# Patient Record
Sex: Female | Born: 1970 | Race: Black or African American | Hispanic: No | Marital: Single | Smoking: Current every day smoker
Health system: Southern US, Community
[De-identification: ages and names within clinical notes are randomized; demographics above are authoritative.]

## PROBLEM LIST (undated history)

## (undated) DIAGNOSIS — I10 Essential (primary) hypertension: Secondary | ICD-10-CM

## (undated) DIAGNOSIS — I469 Cardiac arrest, cause unspecified: Secondary | ICD-10-CM

## (undated) DIAGNOSIS — I517 Cardiomegaly: Secondary | ICD-10-CM

## (undated) HISTORY — DX: Cardiomegaly: I51.7

## (undated) HISTORY — DX: Cardiac arrest, cause unspecified: I46.9

---

## 1997-11-25 ENCOUNTER — Encounter: Admission: RE | Admit: 1997-11-25 | Discharge: 1997-11-25 | Payer: Self-pay | Admitting: Family Medicine

## 1997-12-09 ENCOUNTER — Encounter: Admission: RE | Admit: 1997-12-09 | Discharge: 1997-12-09 | Payer: Self-pay | Admitting: Sports Medicine

## 1998-07-25 ENCOUNTER — Encounter: Admission: RE | Admit: 1998-07-25 | Discharge: 1998-07-25 | Payer: Self-pay | Admitting: Family Medicine

## 1999-07-16 ENCOUNTER — Encounter: Admission: RE | Admit: 1999-07-16 | Discharge: 1999-07-16 | Payer: Self-pay | Admitting: Family Medicine

## 2000-10-14 ENCOUNTER — Inpatient Hospital Stay (HOSPITAL_COMMUNITY): Admission: RE | Admit: 2000-10-14 | Discharge: 2000-10-14 | Payer: Self-pay | Admitting: Obstetrics & Gynecology

## 2000-10-14 ENCOUNTER — Encounter: Admission: RE | Admit: 2000-10-14 | Discharge: 2000-10-14 | Payer: Self-pay | Admitting: Family Medicine

## 2000-10-14 ENCOUNTER — Encounter: Payer: Self-pay | Admitting: Obstetrics & Gynecology

## 2000-10-15 ENCOUNTER — Inpatient Hospital Stay (HOSPITAL_COMMUNITY): Admission: AD | Admit: 2000-10-15 | Discharge: 2000-10-15 | Payer: Self-pay | Admitting: *Deleted

## 2006-09-02 ENCOUNTER — Inpatient Hospital Stay (HOSPITAL_COMMUNITY): Admission: AD | Admit: 2006-09-02 | Discharge: 2006-09-05 | Payer: Self-pay | Admitting: Family Medicine

## 2006-09-02 ENCOUNTER — Ambulatory Visit: Payer: Self-pay | Admitting: Family Medicine

## 2006-09-02 ENCOUNTER — Encounter (INDEPENDENT_AMBULATORY_CARE_PROVIDER_SITE_OTHER): Payer: Self-pay | Admitting: Specialist

## 2006-09-05 ENCOUNTER — Telehealth: Payer: Self-pay | Admitting: *Deleted

## 2006-10-05 ENCOUNTER — Encounter (INDEPENDENT_AMBULATORY_CARE_PROVIDER_SITE_OTHER): Payer: Self-pay | Admitting: *Deleted

## 2007-01-09 ENCOUNTER — Ambulatory Visit: Payer: Self-pay | Admitting: Sports Medicine

## 2007-01-09 ENCOUNTER — Encounter (INDEPENDENT_AMBULATORY_CARE_PROVIDER_SITE_OTHER): Payer: Self-pay | Admitting: Family Medicine

## 2007-01-09 DIAGNOSIS — I10 Essential (primary) hypertension: Secondary | ICD-10-CM

## 2007-01-09 LAB — CONVERTED CEMR LAB
BUN: 15 mg/dL (ref 6–23)
Beta hcg, urine, semiquantitative: NEGATIVE
CO2: 23 meq/L (ref 19–32)
Calcium: 8.9 mg/dL (ref 8.4–10.5)
Chloride: 109 meq/L (ref 96–112)
Creatinine, Ser: 0.89 mg/dL (ref 0.40–1.20)
Glucose, Bld: 90 mg/dL (ref 70–99)
Potassium: 4.4 meq/L (ref 3.5–5.3)
Sodium: 142 meq/L (ref 135–145)

## 2007-01-10 ENCOUNTER — Encounter (INDEPENDENT_AMBULATORY_CARE_PROVIDER_SITE_OTHER): Payer: Self-pay | Admitting: Family Medicine

## 2007-03-15 ENCOUNTER — Telehealth (INDEPENDENT_AMBULATORY_CARE_PROVIDER_SITE_OTHER): Payer: Self-pay | Admitting: Family Medicine

## 2007-05-04 ENCOUNTER — Telehealth: Payer: Self-pay | Admitting: *Deleted

## 2007-09-21 ENCOUNTER — Emergency Department (HOSPITAL_COMMUNITY): Admission: EM | Admit: 2007-09-21 | Discharge: 2007-09-21 | Payer: Self-pay | Admitting: Family Medicine

## 2009-08-05 ENCOUNTER — Encounter: Payer: Self-pay | Admitting: *Deleted

## 2010-03-21 ENCOUNTER — Emergency Department (HOSPITAL_COMMUNITY): Admission: EM | Admit: 2010-03-21 | Discharge: 2010-03-21 | Payer: Self-pay | Admitting: Emergency Medicine

## 2010-05-28 NOTE — Miscellaneous (Signed)
Summary: HTN  Clinical Lists Changes 200/108. feels fine. no medical care in 3 years. no meds for bp in 3 years. has a HA & does not feel well. sent her to ED. told her we are not taking new pts yet but can apply & we will call when we are open to new pt again.told her she must go to ED NOW. she agreed.[place] RN  August 05, 2009 2:42 PM

## 2010-07-07 LAB — POCT I-STAT, CHEM 8
BUN: 13 mg/dL (ref 6–23)
Calcium, Ion: 1.19 mmol/L (ref 1.12–1.32)
Chloride: 105 mEq/L (ref 96–112)
Creatinine, Ser: 0.9 mg/dL (ref 0.4–1.2)
Glucose, Bld: 87 mg/dL (ref 70–99)
HCT: 41 % (ref 36.0–46.0)
Hemoglobin: 13.9 g/dL (ref 12.0–15.0)
Potassium: 3.8 mEq/L (ref 3.5–5.1)
Sodium: 138 mEq/L (ref 135–145)
TCO2: 26 mmol/L (ref 0–100)

## 2010-07-07 LAB — POCT URINALYSIS DIPSTICK
Bilirubin Urine: NEGATIVE
Glucose, UA: NEGATIVE mg/dL
Hgb urine dipstick: NEGATIVE
Ketones, ur: NEGATIVE mg/dL
Nitrite: NEGATIVE
Protein, ur: NEGATIVE mg/dL
Specific Gravity, Urine: 1.02 (ref 1.005–1.030)
Urobilinogen, UA: 0.2 mg/dL (ref 0.0–1.0)
pH: 6 (ref 5.0–8.0)

## 2010-09-08 NOTE — Discharge Summary (Signed)
Raven Martinez, Raven Martinez              ACCOUNT NO.:  [id number]   MEDICAL RECORD NO.:  [id number]          PATIENT TYPE:  INP   LOCATION:  9372                          FACILITY:  [facility]   PHYSICIAN:  Tanya S. [name], M.D.   DATE OF BIRTH:  1971/01/05   DATE OF ADMISSION:  09/02/2006  DATE OF DISCHARGE:  09/05/2006                               DISCHARGE SUMMARY   DISCHARGE DIAGNOSES:  1. Primary low transverse cesarean section for premature rupture of      membranes, breech presentation and hydramnios, variable      decelerations.  2. Chronic hypertension.  3. Drug abuse - marijuana.   DISCHARGE LABORATORIES:  Blood type O positive.  Urine drug screen - THC  positive, but otherwise negative.  This was repeated with the same  result on the day prior to discharge.  Hemoglobin 11.1 prior to cesarean  section and platelet count 167 prior to cesarean section.  HIV  nonreactive.  Creatinine 0.69.  AST 17, ALT 9, glucose 105, LDH 139,  uric acid 4.9.  Rubella immune, RPR nonreactive, hepatitis B surface  antigen negative, sickle screen negative, post cesarean section  hemoglobin 10.8 with platelet count 145.   HOSPITAL COURSE:  The patient is a 40 year old female who presented on  Sep 02, 2006 about 1-1/2 hours after her rupture of membranes.  She  stated that she was having contractions about every 5 minutes.  The  patient's history is complicated by her not receiving any prenatal care  during this pregnancy secondary to not having insurance per patient.  Blood pressure was noted to be markedly elevated in the emergency  department, up to 200s/120s.  She did not have any complaints of  headache, upper abdominal pain, lower extremity swelling, visual  changes, nor did she voice any other complaints.  She denied vaginal  bleeding.  She did have slight new vaginal discharge.  She stated to [place]  that she had been followed for hypertension previously, but has not seen  a physician in some years,  and is not taking any medications for this.  She does smoke cigarettes, and also smokes marijuana.   Other prenatal records were not available from previous pregnancies,  though the patient states that she has had 3 spontaneous vaginal  deliveries and one spontaneous abortion which was her G4 pregnancy.  As  such, she is G5, P3-0-1-3 prior to admission.  On ultrasound, the baby  was found to be approximately 1920 gm with a breech presentation.  There  was no previa noticed.  AFI was markedly decreased to absent.  The  cervix was open, and the fetus was measuring 32 and 6/7ths weeks.   The patient was taken to the operating room for a primary low transverse  cesarean section based on anhydramnios, breech presentation, suspected  premature rupture of membranes and possible preeclampsia, which we were  unsure about because the labs have not returned at that time.  Estimated  blood loss during the procedure was 600 mL, cord pH of 7.31.  A viable  female infant was delivered with Apgars of 8/9, weighing  1725 gm with a  length of 41.5 cm.  The NICU team was there at delivery, and the infant  went to the NICU after delivery.  Infant required blow-by O2 for 4  minutes.  The infant was then breathing spontaneously.  The placenta was  manually removed and had a 3-vessel cord.  The time of delivery of the  infant was 1734 on Sep 02, 2006, and time of delivery of the placenta was  1735 on Sep 02, 2006.   The patient went to the AICU following delivery.  She had been placed on  magnesium shortly after delivery and lisinopril for blood pressure  control with labetalol and hydralazine for systolic blood pressure  greater than 160 and diastolic blood pressure greater than 110.  Blood  pressures ranged from 127-167/77-100 on this regimen.  Leading up to  discharge, her blood pressures ranged from 140-180/87-123.  As such, her  blood pressure regimen was changed.  Her magnesium was stopped, and she  was  placed on hydrochlorothiazide/lisinopril 25/20, and metoprolol 25 mg  b.i.d. with close follow up at [facility] in 2  weeks for a blood pressure check and to change her medications as  appropriate.  She was bottle feeding.  Social work was consulted based  on the patient having marijuana in her urine.  She stated that she had  used marijuana about 1 month ago, but says that she is only an  occasional user.  Social work will continue to follow as an outpatient.  She denied CPS involved with any of her kids.  She was discharged with  ibuprofen and Percocet for pain control.  Depo-Provera was given prior  to discharge for contraception.  Staples were removed prior to  discharge.  An appointment was made at [facility]  with Dr. __________ for follow up of blood pressure management.  She was  discharged on a prenatal vitamin for 6 weeks and also Colace while she  is taking the Percocet.     ______________________________  Norton Blizzard, M.D.    ______________________________  [name]. [name], M.D.    SH/MEDQ  D:  09/05/2006  T:  09/05/2006  Job:  [id number]   cc:   Patricia Nettle(?) Jennefer Bravo(?), M.D.  [facility]

## 2010-09-08 NOTE — Op Note (Signed)
Raven Martinez              ACCOUNT NO.:  [id number]   MEDICAL RECORD NO.:  [id number]          PATIENT TYPE:  INP   LOCATION:  9198                          FACILITY:  [facility]   PHYSICIAN:  Tanya S. [name], M.D.   DATE OF BIRTH:  [birth year]   DATE OF PROCEDURE:  09/02/2006  DATE OF DISCHARGE:                               OPERATIVE REPORT   PREOPERATIVE DIAGNOSIS:  Intrauterine pregnancy at 33 weeks, possible  intrauterine growth restriction, breech presentation, anhydramnios,  hypertension, variable decelerations, no prenatal care.   POSTOPERATIVE DIAGNOSIS:  Intrauterine pregnancy at 33 weeks, possible  intrauterine growth restriction, breech presentation, anhydramnios,  hypertension, variable decelerations, no prenatal care.   PROCEDURE:  Primary low transverse cesarean section.   SURGEON:  [name]. [name], M.D.   ASSISTANT:  Paticia Stack, M.D.   ANESTHESIA:  Spinal/local.   SPECIMENS:  Placenta to pathology.   ESTIMATED BLOOD LOSS:  600 mL.   COMPLICATIONS:  None known immediately.   FINDINGS:  Viable female infant with Apgars 8 and 9.  Cord pH 7.31.  The  baby did go to the NICU, I do not have the weight at the time of  dictation.   REASON FOR PROCEDURE:  Briefly, the patient is a 40 year old gravida 5,  para 3-0-1-0, at 40 weeks by LMP, whose ultrasound today measured 37/6  with asymmetric looking growth, the head was measuring 33 to 34 weeks,  but the body was measuring more like 31, was found to be ruptured in the  unit, had severe hypertension with blood pressures in the 190/100 range.  Labs were all normal but had an anhydramnios by ultrasound and  repetitive variable decels into the 60s and a premature baby in a breech  presentation.  The decision was made to take her to the operating room  for delivery.   DESCRIPTION OF PROCEDURE:  The patient was taken to the OR where she was  placed in the dorsal supine position in Walden stirrups.  After spinal  analgesia was administered, she was prepped and draped in the usual  sterile fashion.  Once spinal analgesia was felt to be adequate, a low  Pfannenstiel incision was made with the knife and carried down to the  underlying fascia which was divided in the midline.  The rest of the  dissection went bluntly with the fascia being separated, the rectus  being separated, the peritoneal cavity being entered bluntly, bladder  blade was placed inside the peritoneum.  A low transverse incision was  made in the uterus and carried down to the underlying amniotic cavity.  The infant was found to be in a frank breech position, was delivered  easily, found to have a nuchal cord x1.  The infant was delivered, the  cord was clamped x2, and the infant was taking to the awaiting  pediatrics.  Cord pH and cord blood were obtained.  The placenta was  delivered from the uterus.  The uterus was cleaned with a dry lap pad.  The edges of the uterine incision was grasped with ring forceps.  The  uterine incision was  closed with 0 Vicryl suture in a locked running  fashion.  A second imbricating layer was used.  Two figure-of-eights  were used to assure hemostasis.  The uterine incision was felt to be  hemostatic.  Attention was turned to the fascia.  The fascia was closed  with a 0 Vicryl suture in a running fashion.  Any bleeding in the subcu  were cauterized with the electrocautery and the skin was closed using  clips.  30 mL of 0.25% Marcaine were injected into the incision.  All  instrument, needle, and lap counts were correct x 2.  The patient was  awakened and taken to the recovery room in stable condition.           ______________________________  [name] [name], M.D.     TSP/MEDQ  D:  09/02/2006  T:  09/02/2006  Job:  [id number]

## 2017-01-15 ENCOUNTER — Ambulatory Visit (INDEPENDENT_AMBULATORY_CARE_PROVIDER_SITE_OTHER): Payer: Self-pay

## 2017-01-15 ENCOUNTER — Ambulatory Visit (HOSPITAL_COMMUNITY)
Admission: EM | Admit: 2017-01-15 | Discharge: 2017-01-15 | Disposition: A | Discharge status: Home / Self Care | Payer: Self-pay | Attending: Family Medicine | Admitting: Family Medicine

## 2017-01-15 ENCOUNTER — Encounter (HOSPITAL_COMMUNITY): Payer: Self-pay | Admitting: Emergency Medicine

## 2017-01-15 DIAGNOSIS — M549 Dorsalgia, unspecified: Secondary | ICD-10-CM

## 2017-01-15 DIAGNOSIS — M546 Pain in thoracic spine: Secondary | ICD-10-CM

## 2017-01-15 DIAGNOSIS — M5489 Other dorsalgia: Secondary | ICD-10-CM

## 2017-01-15 DIAGNOSIS — I10 Essential (primary) hypertension: Secondary | ICD-10-CM

## 2017-01-15 MED ORDER — CYCLOBENZAPRINE HCL 10 MG PO TABS: 10.0000 mg | ORAL_TABLET | Freq: Three times a day (TID) | ORAL | 0 refills | Status: DC
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
## 2017-02-01 ENCOUNTER — Encounter (HOSPITAL_COMMUNITY): Payer: Self-pay | Admitting: Emergency Medicine

## 2017-02-01 ENCOUNTER — Inpatient Hospital Stay (HOSPITAL_COMMUNITY)
Admission: EM | Admit: 2017-02-01 | Discharge: 2017-02-10 | DRG: 224 | Disposition: A | Discharge status: Home / Self Care | Payer: Medicaid Other | Attending: Internal Medicine | Admitting: Internal Medicine

## 2017-02-01 ENCOUNTER — Emergency Department (HOSPITAL_COMMUNITY): Payer: Medicaid Other

## 2017-02-01 DIAGNOSIS — I421 Obstructive hypertrophic cardiomyopathy: Secondary | ICD-10-CM | POA: Diagnosis present

## 2017-02-01 DIAGNOSIS — R9431 Abnormal electrocardiogram [ECG] [EKG]: Secondary | ICD-10-CM | POA: Diagnosis present

## 2017-02-01 DIAGNOSIS — F17200 Nicotine dependence, unspecified, uncomplicated: Secondary | ICD-10-CM | POA: Diagnosis present

## 2017-02-01 DIAGNOSIS — D649 Anemia, unspecified: Secondary | ICD-10-CM | POA: Diagnosis present

## 2017-02-01 DIAGNOSIS — J690 Pneumonitis due to inhalation of food and vomit: Secondary | ICD-10-CM | POA: Diagnosis not present

## 2017-02-01 DIAGNOSIS — E876 Hypokalemia: Secondary | ICD-10-CM

## 2017-02-01 DIAGNOSIS — Z79899 Other long term (current) drug therapy: Secondary | ICD-10-CM

## 2017-02-01 DIAGNOSIS — Z452 Encounter for adjustment and management of vascular access device: Secondary | ICD-10-CM

## 2017-02-01 DIAGNOSIS — I462 Cardiac arrest due to underlying cardiac condition: Secondary | ICD-10-CM | POA: Diagnosis present

## 2017-02-01 DIAGNOSIS — J9601 Acute respiratory failure with hypoxia: Secondary | ICD-10-CM

## 2017-02-01 DIAGNOSIS — I469 Cardiac arrest, cause unspecified: Secondary | ICD-10-CM

## 2017-02-01 DIAGNOSIS — I471 Supraventricular tachycardia: Secondary | ICD-10-CM | POA: Diagnosis not present

## 2017-02-01 DIAGNOSIS — Z959 Presence of cardiac and vascular implant and graft, unspecified: Secondary | ICD-10-CM

## 2017-02-01 DIAGNOSIS — E875 Hyperkalemia: Secondary | ICD-10-CM | POA: Diagnosis not present

## 2017-02-01 DIAGNOSIS — R011 Cardiac murmur, unspecified: Secondary | ICD-10-CM | POA: Diagnosis present

## 2017-02-01 DIAGNOSIS — E269 Hyperaldosteronism, unspecified: Secondary | ICD-10-CM | POA: Diagnosis present

## 2017-02-01 DIAGNOSIS — J189 Pneumonia, unspecified organism: Secondary | ICD-10-CM

## 2017-02-01 DIAGNOSIS — J9690 Respiratory failure, unspecified, unspecified whether with hypoxia or hypercapnia: Secondary | ICD-10-CM

## 2017-02-01 DIAGNOSIS — I499 Cardiac arrhythmia, unspecified: Secondary | ICD-10-CM | POA: Diagnosis not present

## 2017-02-01 DIAGNOSIS — I509 Heart failure, unspecified: Secondary | ICD-10-CM

## 2017-02-01 DIAGNOSIS — I472 Ventricular tachycardia: Secondary | ICD-10-CM | POA: Diagnosis present

## 2017-02-01 DIAGNOSIS — N179 Acute kidney failure, unspecified: Secondary | ICD-10-CM | POA: Diagnosis present

## 2017-02-01 DIAGNOSIS — R739 Hyperglycemia, unspecified: Secondary | ICD-10-CM | POA: Diagnosis present

## 2017-02-01 DIAGNOSIS — I110 Hypertensive heart disease with heart failure: Secondary | ICD-10-CM | POA: Diagnosis present

## 2017-02-01 DIAGNOSIS — G931 Anoxic brain damage, not elsewhere classified: Secondary | ICD-10-CM

## 2017-02-01 DIAGNOSIS — E872 Acidosis: Secondary | ICD-10-CM | POA: Diagnosis present

## 2017-02-01 DIAGNOSIS — I4901 Ventricular fibrillation: Secondary | ICD-10-CM

## 2017-02-01 DIAGNOSIS — I5021 Acute systolic (congestive) heart failure: Secondary | ICD-10-CM | POA: Diagnosis present

## 2017-02-01 DIAGNOSIS — I5043 Acute on chronic combined systolic (congestive) and diastolic (congestive) heart failure: Secondary | ICD-10-CM | POA: Diagnosis present

## 2017-02-01 DIAGNOSIS — Z791 Long term (current) use of non-steroidal anti-inflammatories (NSAID): Secondary | ICD-10-CM

## 2017-02-01 DIAGNOSIS — Z978 Presence of other specified devices: Secondary | ICD-10-CM

## 2017-02-01 LAB — CBC WITH DIFFERENTIAL/PLATELET
BASOS PCT: 0 %
Basophils Absolute: 0 10*3/uL (ref 0.0–0.1)
EOS PCT: 1 %
Eosinophils Absolute: 0.1 10*3/uL (ref 0.0–0.7)
HEMATOCRIT: 31.9 % — AB (ref 36.0–46.0)
Hemoglobin: 10.4 g/dL — ABNORMAL LOW (ref 12.0–15.0)
LYMPHS ABS: 7.5 10*3/uL — AB (ref 0.7–4.0)
Lymphocytes Relative: 76 %
MCH: 26 pg (ref 26.0–34.0)
MCHC: 32.6 g/dL (ref 30.0–36.0)
MCV: 79.8 fL (ref 78.0–100.0)
MONO ABS: 0.4 10*3/uL (ref 0.1–1.0)
Monocytes Relative: 4 %
Neutro Abs: 1.9 10*3/uL (ref 1.7–7.7)
Neutrophils Relative %: 19 %
Platelets: 132 10*3/uL — ABNORMAL LOW (ref 150–400)
RBC: 4 MIL/uL (ref 3.87–5.11)
RDW: 15.1 % (ref 11.5–15.5)
WBC: 9.9 10*3/uL (ref 4.0–10.5)

## 2017-02-01 LAB — COMPREHENSIVE METABOLIC PANEL
ALT: 15 U/L (ref 14–54)
ANION GAP: 18 — AB (ref 5–15)
AST: 38 U/L (ref 15–41)
Albumin: 3.2 g/dL — ABNORMAL LOW (ref 3.5–5.0)
Alkaline Phosphatase: 47 U/L (ref 38–126)
BILIRUBIN TOTAL: 0.8 mg/dL (ref 0.3–1.2)
BUN: 10 mg/dL (ref 6–20)
CO2: 11 mmol/L — ABNORMAL LOW (ref 22–32)
Calcium: 7.7 mg/dL — ABNORMAL LOW (ref 8.9–10.3)
Chloride: 102 mmol/L (ref 101–111)
Creatinine, Ser: 1.07 mg/dL — ABNORMAL HIGH (ref 0.44–1.00)
GFR calc Af Amer: >60 mL/min (ref >60)
Glucose, Bld: 345 mg/dL — ABNORMAL HIGH (ref 65–99)
POTASSIUM: 2.4 mmol/L — AB (ref 3.5–5.1)
Sodium: 131 mmol/L — ABNORMAL LOW (ref 135–145)
TOTAL PROTEIN: 5.2 g/dL — AB (ref 6.5–8.1)

## 2017-02-01 LAB — PROTIME-INR
INR: 1.29
PROTHROMBIN TIME: 16 s — AB (ref 11.4–15.2)

## 2017-02-01 LAB — I-STAT TROPONIN, ED: TROPONIN I, POC: 0.12 ng/mL — AB (ref 0.00–0.08)

## 2017-02-01 LAB — I-STAT CG4 LACTIC ACID, ED: Lactic Acid, Venous: 11.11 mmol/L (ref 0.5–1.9)

## 2017-02-01 LAB — POC URINE PREG, ED: PREG TEST UR: NEGATIVE

## 2017-02-01 MED ORDER — HEPARIN SODIUM (PORCINE) 5000 UNIT/ML IJ SOLN: 4000.0000 [IU] | Freq: Once | INTRAMUSCULAR | Status: DC

## 2017-02-01 MED ORDER — AMIODARONE HCL IN DEXTROSE 360-4.14 MG/200ML-% IV SOLN
60.0000 mg/h | Freq: Once | INTRAVENOUS | Status: AC
  Administered 2017-02-01: 60 mg/h via INTRAVENOUS
  Filled 2017-02-01: qty 200

## 2017-02-01 MED ORDER — POTASSIUM CHLORIDE 10 MEQ/100ML IV SOLN
INTRAVENOUS | Status: AC
  Filled 2017-02-01: qty 100

## 2017-02-01 MED ORDER — ASPIRIN 300 MG RE SUPP
300.0000 mg | Freq: Once | RECTAL | Status: AC
  Administered 2017-02-02: 300 mg via RECTAL
  Filled 2017-02-01: qty 1

## 2017-02-01 NOTE — H&P (Signed)
Cardiology Admission History and Physical:   Patient ID: Raven Martinez; MRN: [id number]; DOB: 06/24/1970   Admission date: 02/01/2017  Primary Care Provider: Patient, No Pcp Per Primary Cardiologist: New  Chief Complaint:  Cardiac arrest  Patient Profile:   Raven Martinez is a 46 y.o. female with a history of HTN and smoking who was brought to ER after cardiac arrest.   History of Present Illness:   Raven Martinez was in her usual state of health until this evening.  She was noted by family to pass out and fall over while in her home.  EMS was called but she did not get CPR.  EMS arrived and CPR was begun, estimated down-time about 20 minutes.  She was in ventricular fibrillation and was shocked.  She subsequently went into VT.  Per EMS, total of 5 shocks before she regained a stable perfusing rhythm.  She is currently in NSR.  Initially in the ER, she was moving her arms and pulling at the tube.  BP is stable.  Lactate 11 and TnI 0.12.  She has a history of smoking and HTN.   ECG shows NSR with LVH and repolarization abnormality.   PMH: 1. HTN 2. Active smoker  Past Surgical History:  Procedure Laterality Date  . CESAREAN SECTION       Medications Prior to Admission: Prior to Admission medications   Medication Sig Start Date End Date Taking? Authorizing Provider  cyclobenzaprine (FLEXERIL) 10 MG tablet Take 1 tablet (10 mg total) by mouth 3 (three) times daily. 01/15/17   Mardella Layman, MD  Naproxen Sodium (ALEVE PO) Take by mouth.    [provider]     Allergies:   No Known Allergies  Social History:   Social History   Social History  . Marital status: Single    Spouse name: N/A  . Number of children: N/A  . Years of education: N/A   Occupational History  . Not on file.   Social History Main Topics  . Smoking status: Current Every Day Smoker  . Smokeless tobacco: Not on file  . Alcohol use Yes  . Drug use: Yes    Types: Marijuana  . Sexual activity:  Not on file   Other Topics Concern  . Not on file   Social History Narrative  . No narrative on file    Family History:  No known premature CAD.   ROS:  All systems reviewed and negative except as per HPI.     Physical Exam/Data:   Vitals:   02/01/17 2315 02/01/17 2325 02/01/17 2330 02/01/17 2340  BP: (!) 130/106 (!) 127/105 (!) 124/103 (!) 126/101  Pulse: (!) 102 100 96 96  Resp: Temp:      TempSrc:      SpO2: 100% 100% 100% 100%    Intake/Output Summary (Last 24 hours) at 02/01/17 2358 Last data filed at 02/01/17 2327  Gross per 24 hour  Intake             1000 ml  Output                0 ml  Net             1000 ml   There were no vitals filed for this visit. There is no height or weight on file to calculate BMI.  General:  Intubated/sedated.  HEENT: normal Lymph: no adenopathy Neck: no JVD Endocrine:  No thryomegaly Vascular:  No carotid bruits; FA pulses 2+ bilaterally without bruits  Cardiac:  normal S1, S2; RRR; no murmur Lungs:  clear to auscultation bilaterally, no wheezing, rhonchi or rales  Abd: soft, nontender, no hepatomegaly  Ext: no edema Musculoskeletal:  No deformities, BUE and BLE strength normal and equal Skin: warm and dry  Neuro:  Moves when stimulated.     EKG:  The ECG was personally reviewed and demonstrates NSR, LVH, repolarization abnormality.   Laboratory Data:  Chemistry Recent Labs Lab 02/01/17 2315  NA 131*  K 2.4*  CL 102  CO2 11*  GLUCOSE 345*  BUN 10  CREATININE 1.07*  CALCIUM 7.7*  GFRNONAA >60  GFRAA >60  ANIONGAP 18*     Recent Labs Lab 02/01/17 2315  PROT 5.2*  ALBUMIN 3.2*  AST 38  ALT 15  ALKPHOS 47  BILITOT 0.8   Hematology Recent Labs Lab 02/01/17 2315  WBC 9.9  RBC 4.00  HGB 10.4*  HCT 31.9*  MCV 79.8  MCH 26.0  MCHC 32.6  RDW 15.1  PLT 132*   Cardiac EnzymesNo results for input(s): TROPONINI in the last 168 hours.  Recent Labs Lab 02/01/17 2315  TROPIPOC 0.12*      BNPNo results for input(s): BNP, PROBNP in the last 168 hours.  DDimer No results for input(s): DDIMER in the last 168 hours.  Radiology/Studies:  No results found.  Assessment and Plan:   1. Ventricular fibrillation arrest: Patient was noted by family to pass out at home, no prior complaints that day.  She was found by EMS to have VF and was shocked a total of 5 times with ROSC.  She was down about 20 minutes prior to CPR but per ER MD was moving arms spontaneously and pulling at vent when she arrived in the ER.  ECG with LVH/repolarization abnormality.  This appears to be primary VF arrest in smoker with HTN.  We will plan therapeutic hypothermia and will plan urgent coronary angiography to assess for CAD as cause.  She is hypokalemic, will give 40 mEq KCl IV and 20 mEq liquid per tube. TnI mildly elevated, could be due to MI but also could be due to prolonged hypotension (demand ischemia).  2. HTN: No current meds at home.  3. Active smoking: will need to quit.   For questions or updates, please contact [facility] Please consult [email or web address] for contact info under Cardiology/STEMI.   Signed, Marca Ancona, MD  02/01/2017 11:58 PM

## 2017-02-01 NOTE — ED Notes (Signed)
No purposeful movement at this time, no painful response to stimuli. RT placing arterial line at this time.

## 2017-02-01 NOTE — ED Notes (Signed)
Cardiology at bedside.

## 2017-02-01 NOTE — ED Notes (Addendum)
Pt arrives post arrest, CPR started at [address] [facility] initial rhythm. Pt shocked for a total of FIVE times, was in [facility] and VFIB. Given 5 epis and 300 MG amioderone. Pulses back at 2253. Breathing over ETT tube and making some purposeful movement.

## 2017-02-01 NOTE — Progress Notes (Signed)
Left radial art line attempted X2 unsuccessful. Got back great blood return catheter wouldn't thread. Patient radial artery anatomy is very close to her radial wrist bone. NP Renae Fickle aware.

## 2017-02-01 NOTE — ED Notes (Signed)
Date and time results received: 02/01/17 11:28 PM  (use smartphrase ".now" to insert current time)  Test: Troponin Critical Value: 0.12  Name of Provider Notified: Dr. Blinda Leatherwood Orders Received? Or Actions Taken?: no new orders, awaiting cardiology

## 2017-02-01 NOTE — ED Provider Notes (Signed)
[facility]-EMERGENCY DEPT Provider Note   CSN: [id number] Arrival date & time: 02/01/17  2308     History   Chief Complaint No chief complaint on file.   HPI Raven Martinez is a 46 y.o. female.  Patient brought to the ER from home by ambulance. Patient collapsed while talking to her friends. Friend reported that she was sitting on the couch and suddenly sounded like she was snoring and then fell over. 911 was called, no one initiated CPR. First responders found her pulseless and did initiate CPR. EMS arrived, intubated her and found her to be in V. tach/V. fib. Patient was shocked a total of five times during their stabilization. An transportation, intermittently exhibiting V. tach and V. fib. Patient regained spontaneous circulation and has been initiating breaths. No other information is available. Level V Caveat due to acuity.      History reviewed. No pertinent past medical history.  Patient Active Problem List   Diagnosis Date Noted  . HYPERTENSION, BENIGN 01/09/2007    Past Surgical History:  Procedure Laterality Date  . CESAREAN SECTION      OB History    No data available       Home Medications    Prior to Admission medications   Medication Sig Start Date End Date Taking? Authorizing Provider  cyclobenzaprine (FLEXERIL) 10 MG tablet Take 1 tablet (10 mg total) by mouth 3 (three) times daily. 01/15/17   Mardella Layman, MD  Naproxen Sodium (ALEVE PO) Take by mouth.    [provider]    Family History History reviewed. No pertinent family history.  Social History Social History  Substance Use Topics  . Smoking status: Current Every Day Smoker  . Smokeless tobacco: Not on file  . Alcohol use Yes     Allergies   Patient has no known allergies.   Review of Systems Review of Systems  Unable to perform ROS: Acuity of condition     Physical Exam Updated Vital Signs BP (!) 126/101   Pulse 96   Temp (!) 97.4 F (36.3 C) (Rectal)   Resp 18    LMP 01/03/2017   SpO2 100%   Physical Exam  HENT:  Head: Atraumatic.  Eyes:  5mm sluggish  Neck: Neck supple.  Cardiovascular: Normal rate and regular rhythm.   Pulmonary/Chest:  Assisted by bag via ETT, coarse and equal bilaterally  Abdominal: Soft.  Musculoskeletal: She exhibits no edema or deformity.  Neurological: She is unresponsive.  Skin: Skin is warm, dry and intact. She is not diaphoretic.     ED Treatments / Results  Labs (all labs ordered are listed, but only abnormal results are displayed) Labs Reviewed  CBC WITH DIFFERENTIAL/PLATELET - Abnormal; Notable for the following:       Result Value   Hemoglobin 10.4 (*)    HCT 31.9 (*)    Platelets 132 (*)    All other components within normal limits  COMPREHENSIVE METABOLIC PANEL - Abnormal; Notable for the following:    Sodium 131 (*)    Potassium 2.4 (*)    CO2 11 (*)    Glucose, Bld 345 (*)    Creatinine, Ser 1.07 (*)    Calcium 7.7 (*)    Total Protein 5.2 (*)    Albumin 3.2 (*)    Anion gap 18 (*)    All other components within normal limits  PROTIME-INR - Abnormal; Notable for the following:    Prothrombin Time 16.0 (*)    All other  components within normal limits  I-STAT CG4 LACTIC ACID, ED - Abnormal; Notable for the following:    Lactic Acid, Venous 11.11 (*)    All other components within normal limits  I-STAT TROPONIN, ED - Abnormal; Notable for the following:    Troponin i, poc 0.12 (*)    All other components within normal limits  URINALYSIS, ROUTINE W REFLEX MICROSCOPIC  RAPID URINE DRUG SCREEN, HOSP PERFORMED  POC URINE PREG, ED    EKG  EKG Interpretation  Date/Time:  Tuesday February 01 2017 23:26:30 EDT Ventricular Rate:  99 PR Interval:    QRS Duration: 119 QT Interval:  411 QTC Calculation: 528 R Axis:   -41 Text Interpretation:  Sinus rhythm Prolonged PR interval Biatrial enlargement LVH with IVCD, LAD and secondary repol abnrm Inferior infarct, old Prolonged QT interval  Confirmed by Gilda Crease [id number]) on 02/01/2017 11:39:06 PM       Radiology No results found.  Procedures Procedures (including critical care time)  Medications Ordered in ED Medications  heparin injection 60 Units/kg (not administered)  aspirin suppository 300 mg (not administered)  amiodarone (NEXTERONE PREMIX) 360-4.14 MG/200ML-% (1.8 mg/mL) IV infusion (60 mg/hr Intravenous New Bag/Given 02/01/17 2327)     Initial Impression / Assessment and Plan / ED Course  I have reviewed the triage vital signs and the nursing notes.  Pertinent labs & imaging results that were available during my care of the patient were reviewed by me and considered in my medical decision making (see chart for details).     Patient arrived via EMS after cardiopulmonary arrest. Patient suddenly collapsed at home and was found to be intermittently in V. tach and V. fib requiring 5 shocks by EMS. This does sound suspicious for a primary V. tach/V. fib arrest. EKG at arrival reveals either diffuse ischemia or LVH with secondary repolarization, no previous available. Patient does not have any known coronary artery disease, was recently seen in urgent care and documented to be markedly hypertensive at that time. She does smoke as well. Remainder of his factors unavailable secondary to her condition.  ETT placement was confirmed at arrival. She had been administered amiodarone by bolus. She was started on amiodarone drip. Dr. Shirlee Latch, cardiology was consulted. He has seen the patient and recommended cooling and emergent cardiac catheterization.  Discussed with Dr. Delton Coombes, critical care. Patient will be seen by critical care team.  CRITICAL CARE Performed by: Gilda Crease.   Total critical care time: 35 minutes  Critical care time was exclusive of separately billable procedures and treating other patients.  Critical care was necessary to treat or prevent imminent or life-threatening  deterioration.  Critical care was time spent personally by me on the following activities: development of treatment plan with patient and/or surrogate as well as nursing, discussions with consultants, evaluation of patient's response to treatment, examination of patient, obtaining history from patient or surrogate, ordering and performing treatments and interventions, ordering and review of laboratory studies, ordering and review of radiographic studies, pulse oximetry and re-evaluation of patient's condition.   Final Clinical Impressions(s) / ED Diagnoses   Final diagnoses:  Ventricular fibrillation [facility])    New Prescriptions New Prescriptions   No medications on file     Gilda Crease, MD 02/01/17 2358

## 2017-02-01 NOTE — ED Notes (Signed)
CCM at bedside 

## 2017-02-02 ENCOUNTER — Inpatient Hospital Stay (HOSPITAL_COMMUNITY): Payer: Medicaid Other

## 2017-02-02 ENCOUNTER — Encounter (HOSPITAL_COMMUNITY): Payer: Self-pay | Admitting: Interventional Cardiology

## 2017-02-02 ENCOUNTER — Encounter (HOSPITAL_COMMUNITY)
Admission: EM | Disposition: A | Discharge status: Home / Self Care | Payer: Self-pay | Source: Home / Self Care | Attending: Internal Medicine

## 2017-02-02 DIAGNOSIS — J690 Pneumonitis due to inhalation of food and vomit: Secondary | ICD-10-CM | POA: Diagnosis not present

## 2017-02-02 DIAGNOSIS — I499 Cardiac arrhythmia, unspecified: Secondary | ICD-10-CM | POA: Diagnosis not present

## 2017-02-02 DIAGNOSIS — Z791 Long term (current) use of non-steroidal anti-inflammatories (NSAID): Secondary | ICD-10-CM | POA: Diagnosis not present

## 2017-02-02 DIAGNOSIS — G931 Anoxic brain damage, not elsewhere classified: Secondary | ICD-10-CM | POA: Diagnosis present

## 2017-02-02 DIAGNOSIS — E876 Hypokalemia: Secondary | ICD-10-CM

## 2017-02-02 DIAGNOSIS — I462 Cardiac arrest due to underlying cardiac condition: Secondary | ICD-10-CM | POA: Diagnosis present

## 2017-02-02 DIAGNOSIS — I5043 Acute on chronic combined systolic (congestive) and diastolic (congestive) heart failure: Secondary | ICD-10-CM | POA: Diagnosis present

## 2017-02-02 DIAGNOSIS — J9601 Acute respiratory failure with hypoxia: Secondary | ICD-10-CM

## 2017-02-02 DIAGNOSIS — Z79899 Other long term (current) drug therapy: Secondary | ICD-10-CM | POA: Diagnosis not present

## 2017-02-02 DIAGNOSIS — I4901 Ventricular fibrillation: Secondary | ICD-10-CM | POA: Diagnosis present

## 2017-02-02 DIAGNOSIS — R55 Syncope and collapse: Secondary | ICD-10-CM | POA: Diagnosis present

## 2017-02-02 DIAGNOSIS — F17200 Nicotine dependence, unspecified, uncomplicated: Secondary | ICD-10-CM | POA: Diagnosis present

## 2017-02-02 DIAGNOSIS — I421 Obstructive hypertrophic cardiomyopathy: Secondary | ICD-10-CM | POA: Diagnosis present

## 2017-02-02 DIAGNOSIS — I110 Hypertensive heart disease with heart failure: Secondary | ICD-10-CM | POA: Diagnosis present

## 2017-02-02 DIAGNOSIS — R011 Cardiac murmur, unspecified: Secondary | ICD-10-CM | POA: Diagnosis present

## 2017-02-02 DIAGNOSIS — I469 Cardiac arrest, cause unspecified: Secondary | ICD-10-CM

## 2017-02-02 DIAGNOSIS — R739 Hyperglycemia, unspecified: Secondary | ICD-10-CM | POA: Diagnosis present

## 2017-02-02 DIAGNOSIS — I471 Supraventricular tachycardia: Secondary | ICD-10-CM | POA: Diagnosis not present

## 2017-02-02 DIAGNOSIS — E269 Hyperaldosteronism, unspecified: Secondary | ICD-10-CM | POA: Diagnosis present

## 2017-02-02 DIAGNOSIS — N179 Acute kidney failure, unspecified: Secondary | ICD-10-CM | POA: Diagnosis present

## 2017-02-02 DIAGNOSIS — E872 Acidosis: Secondary | ICD-10-CM | POA: Diagnosis present

## 2017-02-02 DIAGNOSIS — I472 Ventricular tachycardia: Secondary | ICD-10-CM | POA: Diagnosis present

## 2017-02-02 DIAGNOSIS — D649 Anemia, unspecified: Secondary | ICD-10-CM | POA: Diagnosis present

## 2017-02-02 DIAGNOSIS — E875 Hyperkalemia: Secondary | ICD-10-CM | POA: Diagnosis not present

## 2017-02-02 HISTORY — PX: LEFT HEART CATH AND CORONARY ANGIOGRAPHY: CATH118249

## 2017-02-02 LAB — URINALYSIS, ROUTINE W REFLEX MICROSCOPIC
BACTERIA UA: NONE SEEN
Bilirubin Urine: NEGATIVE
Glucose, UA: NEGATIVE mg/dL
Ketones, ur: NEGATIVE mg/dL
Leukocytes, UA: NEGATIVE
Nitrite: NEGATIVE
Protein, ur: NEGATIVE mg/dL
SPECIFIC GRAVITY, URINE: 1.003 — AB (ref 1.005–1.030)
Squamous Epithelial / LPF: NONE SEEN
pH: 6 (ref 5.0–8.0)

## 2017-02-02 LAB — GLUCOSE, CAPILLARY
GLUCOSE-CAPILLARY: 108 mg/dL — AB (ref 65–99)
GLUCOSE-CAPILLARY: 110 mg/dL — AB (ref 65–99)
GLUCOSE-CAPILLARY: 111 mg/dL — AB (ref 65–99)
GLUCOSE-CAPILLARY: 126 mg/dL — AB (ref 65–99)
GLUCOSE-CAPILLARY: 152 mg/dL — AB (ref 65–99)
GLUCOSE-CAPILLARY: 160 mg/dL — AB (ref 65–99)
GLUCOSE-CAPILLARY: 169 mg/dL — AB (ref 65–99)
GLUCOSE-CAPILLARY: 169 mg/dL — AB (ref 65–99)
GLUCOSE-CAPILLARY: 48 mg/dL — AB (ref 65–99)
Glucose-Capillary: 213 mg/dL — ABNORMAL HIGH (ref 65–99)
Glucose-Capillary: 108 mg/dL — ABNORMAL HIGH (ref 65–99)
Glucose-Capillary: 247 mg/dL — ABNORMAL HIGH (ref 65–99)
Glucose-Capillary: 135 mg/dL — ABNORMAL HIGH (ref 65–99)
Glucose-Capillary: 103 mg/dL — ABNORMAL HIGH (ref 65–99)

## 2017-02-02 LAB — POCT I-STAT, CHEM 8
BUN: 9 mg/dL (ref 6–20)
BUN: 9 mg/dL (ref 6–20)
BUN: 12 mg/dL (ref 6–20)
BUN: 15 mg/dL (ref 6–20)
BUN: 15 mg/dL (ref 6–20)
CALCIUM ION: 1.07 mmol/L — AB (ref 1.15–1.40)
CALCIUM ION: 1.12 mmol/L — AB (ref 1.15–1.40)
CALCIUM ION: 1.07 mmol/L — AB (ref 1.15–1.40)
CHLORIDE: 105 mmol/L (ref 101–111)
CHLORIDE: 109 mmol/L (ref 101–111)
CHLORIDE: 105 mmol/L (ref 101–111)
CHLORIDE: 103 mmol/L (ref 101–111)
CREATININE: 0.8 mg/dL (ref 0.44–1.00)
CREATININE: 0.6 mg/dL (ref 0.44–1.00)
Calcium, Ion: 1.11 mmol/L — ABNORMAL LOW (ref 1.15–1.40)
Calcium, Ion: 1.07 mmol/L — ABNORMAL LOW (ref 1.15–1.40)
Chloride: 103 mmol/L (ref 101–111)
Creatinine, Ser: 0.5 mg/dL (ref 0.44–1.00)
Creatinine, Ser: 0.7 mg/dL (ref 0.44–1.00)
Creatinine, Ser: 0.6 mg/dL (ref 0.44–1.00)
GLUCOSE: 210 mg/dL — AB (ref 65–99)
GLUCOSE: 62 mg/dL — AB (ref 65–99)
GLUCOSE: 153 mg/dL — AB (ref 65–99)
Glucose, Bld: 152 mg/dL — ABNORMAL HIGH (ref 65–99)
Glucose, Bld: 178 mg/dL — ABNORMAL HIGH (ref 65–99)
HCT: 38 % (ref 36.0–46.0)
HCT: 37 % (ref 36.0–46.0)
HCT: 35 % — ABNORMAL LOW (ref 36.0–46.0)
HEMATOCRIT: 34 % — AB (ref 36.0–46.0)
HEMATOCRIT: 35 % — AB (ref 36.0–46.0)
Hemoglobin: 12.9 g/dL (ref 12.0–15.0)
Hemoglobin: 12.6 g/dL (ref 12.0–15.0)
Hemoglobin: 11.6 g/dL — ABNORMAL LOW (ref 12.0–15.0)
Hemoglobin: 11.9 g/dL — ABNORMAL LOW (ref 12.0–15.0)
Hemoglobin: 11.9 g/dL — ABNORMAL LOW (ref 12.0–15.0)
POTASSIUM: 3.8 mmol/L (ref 3.5–5.1)
Potassium: 3.2 mmol/L — ABNORMAL LOW (ref 3.5–5.1)
Potassium: 2.8 mmol/L — ABNORMAL LOW (ref 3.5–5.1)
Potassium: 3.3 mmol/L — ABNORMAL LOW (ref 3.5–5.1)
Potassium: 3.7 mmol/L (ref 3.5–5.1)
SODIUM: 142 mmol/L (ref 135–145)
SODIUM: 139 mmol/L (ref 135–145)
SODIUM: 138 mmol/L (ref 135–145)
Sodium: 139 mmol/L (ref 135–145)
Sodium: 138 mmol/L (ref 135–145)
TCO2: 18 mmol/L — ABNORMAL LOW (ref 22–32)
TCO2: 18 mmol/L — AB (ref 22–32)
TCO2: 19 mmol/L — ABNORMAL LOW (ref 22–32)
TCO2: 22 mmol/L (ref 22–32)
TCO2: 22 mmol/L (ref 22–32)

## 2017-02-02 LAB — BASIC METABOLIC PANEL
Anion gap: 16 — ABNORMAL HIGH (ref 5–15)
Anion gap: 8 (ref 5–15)
Anion gap: 7 (ref 5–15)
BUN: 10 mg/dL (ref 6–20)
BUN: 13 mg/dL (ref 6–20)
BUN: 12 mg/dL (ref 6–20)
CALCIUM: 6.8 mg/dL — AB (ref 8.9–10.3)
CHLORIDE: 107 mmol/L (ref 101–111)
CHLORIDE: 108 mmol/L (ref 101–111)
CO2: 12 mmol/L — ABNORMAL LOW (ref 22–32)
CO2: 19 mmol/L — ABNORMAL LOW (ref 22–32)
CO2: 20 mmol/L — ABNORMAL LOW (ref 22–32)
CREATININE: 0.92 mg/dL (ref 0.44–1.00)
CREATININE: 0.65 mg/dL (ref 0.44–1.00)
Calcium: 7.5 mg/dL — ABNORMAL LOW (ref 8.9–10.3)
Calcium: 6.8 mg/dL — ABNORMAL LOW (ref 8.9–10.3)
Chloride: 108 mmol/L (ref 101–111)
Creatinine, Ser: 0.69 mg/dL (ref 0.44–1.00)
GFR calc Af Amer: >60 mL/min (ref >60)
GFR calc non Af Amer: >60 mL/min (ref >60)
Glucose, Bld: 208 mg/dL — ABNORMAL HIGH (ref 65–99)
Glucose, Bld: 134 mg/dL — ABNORMAL HIGH (ref 65–99)
Glucose, Bld: 105 mg/dL — ABNORMAL HIGH (ref 65–99)
POTASSIUM: 3.1 mmol/L — AB (ref 3.5–5.1)
POTASSIUM: 3.6 mmol/L (ref 3.5–5.1)
Potassium: 3.1 mmol/L — ABNORMAL LOW (ref 3.5–5.1)
SODIUM: 135 mmol/L (ref 135–145)
SODIUM: 135 mmol/L (ref 135–145)
SODIUM: 135 mmol/L (ref 135–145)

## 2017-02-02 LAB — BLOOD GAS, ARTERIAL
Acid-base deficit: 14.2 mmol/L — ABNORMAL HIGH (ref 0.0–2.0)
Acid-base deficit: 8.5 mmol/L — ABNORMAL HIGH (ref 0.0–2.0)
BICARBONATE: 13.6 mmol/L — AB (ref 20.0–28.0)
Bicarbonate: 16.9 mmol/L — ABNORMAL LOW (ref 20.0–28.0)
DRAWN BY: 418751
Drawn by: 418751
FIO2: 100
FIO2: 80
LHR: 20 {breaths}/min
O2 SAT: 99.6 %
O2 Saturation: 99.2 %
PATIENT TEMPERATURE: 98.6
PATIENT TEMPERATURE: 98.6
PCO2 ART: 36.7 mmHg (ref 32.0–48.0)
PEEP: 5 cmH2O
PEEP: 5 cmH2O
PH ART: 7.285 — AB (ref 7.350–7.450)
PO2 ART: 344 mmHg — AB (ref 83.0–108.0)
RATE: 28 resp/min
VT: 420 mL
VT: 420 mL
pCO2 arterial: 45.1 mmHg (ref 32.0–48.0)
pH, Arterial: 7.106 — CL (ref 7.350–7.450)
pO2, Arterial: 456 mmHg — ABNORMAL HIGH (ref 83.0–108.0)

## 2017-02-02 LAB — POCT I-STAT 3, ART BLOOD GAS (G3+)
ACID-BASE DEFICIT: 6 mmol/L — AB (ref 0.0–2.0)
BICARBONATE: 19.3 mmol/L — AB (ref 20.0–28.0)
O2 Saturation: 100 %
Patient temperature: 33.3
TCO2: 20 mmol/L — AB (ref 22–32)
pCO2 arterial: 31.1 mmHg — ABNORMAL LOW (ref 32.0–48.0)
pH, Arterial: 7.382 (ref 7.350–7.450)
pO2, Arterial: 340 mmHg — ABNORMAL HIGH (ref 83.0–108.0)

## 2017-02-02 LAB — RAPID URINE DRUG SCREEN, HOSP PERFORMED
AMPHETAMINES: NOT DETECTED
Barbiturates: NOT DETECTED
Benzodiazepines: NOT DETECTED
COCAINE: NOT DETECTED
OPIATES: NOT DETECTED
TETRAHYDROCANNABINOL: POSITIVE — AB

## 2017-02-02 LAB — LACTIC ACID, PLASMA
LACTIC ACID, VENOUS: 1.8 mmol/L (ref 0.5–1.9)
Lactic Acid, Venous: 3.5 mmol/L (ref 0.5–1.9)
Lactic Acid, Venous: 1.6 mmol/L (ref 0.5–1.9)

## 2017-02-02 LAB — TROPONIN I
TROPONIN I: 0.95 ng/mL — AB (ref <0.03)
Troponin I: 1.33 ng/mL (ref <0.03)
Troponin I: 0.92 ng/mL (ref <0.03)
Troponin I: 0.56 ng/mL (ref <0.03)

## 2017-02-02 LAB — PHOSPHORUS
Phosphorus: 3.8 mg/dL (ref 2.5–4.6)
Phosphorus: 1.8 mg/dL — ABNORMAL LOW (ref 2.5–4.6)

## 2017-02-02 LAB — APTT
APTT: 30 s (ref 24–36)
APTT: 35 s (ref 24–36)

## 2017-02-02 LAB — MRSA PCR SCREENING: MRSA BY PCR: NEGATIVE

## 2017-02-02 LAB — CBC
HCT: 32.9 % — ABNORMAL LOW (ref 36.0–46.0)
Hemoglobin: 10.8 g/dL — ABNORMAL LOW (ref 12.0–15.0)
MCH: 25.8 pg — AB (ref 26.0–34.0)
MCHC: 32.8 g/dL (ref 30.0–36.0)
MCV: 78.5 fL (ref 78.0–100.0)
PLATELETS: 221 10*3/uL (ref 150–400)
RBC: 4.19 MIL/uL (ref 3.87–5.11)
RDW: 15.1 % (ref 11.5–15.5)
WBC: 7.6 10*3/uL (ref 4.0–10.5)

## 2017-02-02 LAB — PROTIME-INR
INR: 1.18
INR: 1.15
PROTHROMBIN TIME: 14.9 s (ref 11.4–15.2)
PROTHROMBIN TIME: 14.6 s (ref 11.4–15.2)

## 2017-02-02 LAB — HIV ANTIBODY (ROUTINE TESTING W REFLEX): HIV Screen 4th Generation wRfx: NONREACTIVE

## 2017-02-02 LAB — PATHOLOGIST SMEAR REVIEW

## 2017-02-02 LAB — MAGNESIUM
MAGNESIUM: 1.3 mg/dL — AB (ref 1.7–2.4)
Magnesium: 1.5 mg/dL — ABNORMAL LOW (ref 1.7–2.4)

## 2017-02-02 SURGERY — LEFT HEART CATH AND CORONARY ANGIOGRAPHY
Anesthesia: LOCAL

## 2017-02-02 MED ORDER — SODIUM CHLORIDE 0.9% FLUSH
3.0000 mL | Freq: Two times a day (BID) | INTRAVENOUS | Status: DC
  Administered 2017-02-02 – 2017-02-04 (×3): 3 mL via INTRAVENOUS

## 2017-02-02 MED ORDER — AMIODARONE HCL IN DEXTROSE 360-4.14 MG/200ML-% IV SOLN
60.0000 mg/h | INTRAVENOUS | Status: AC
  Administered 2017-02-02: 60 mg/h via INTRAVENOUS
  Filled 2017-02-02: qty 200

## 2017-02-02 MED ORDER — MIDAZOLAM HCL 2 MG/2ML IJ SOLN
INTRAMUSCULAR | Status: AC
  Filled 2017-02-02: qty 2

## 2017-02-02 MED ORDER — SODIUM BICARBONATE 8.4 % IV SOLN
INTRAVENOUS | Status: DC
  Administered 2017-02-02 – 2017-02-03 (×2): via INTRAVENOUS
  Filled 2017-02-02 (×5): qty 150

## 2017-02-02 MED ORDER — PANTOPRAZOLE SODIUM 40 MG IV SOLR
40.0000 mg | Freq: Every day | INTRAVENOUS | Status: DC
  Administered 2017-02-02 – 2017-02-04 (×3): 40 mg via INTRAVENOUS
  Filled 2017-02-02 (×3): qty 40

## 2017-02-02 MED ORDER — HEPARIN (PORCINE) IN NACL 2-0.9 UNIT/ML-% IJ SOLN
INTRAMUSCULAR | Status: AC | PRN
  Administered 2017-02-02: 1000 mL

## 2017-02-02 MED ORDER — POTASSIUM CHLORIDE 10 MEQ/50ML IV SOLN
10.0000 meq | INTRAVENOUS | Status: AC
  Administered 2017-02-02 – 2017-02-03 (×4): 10 meq via INTRAVENOUS
  Filled 2017-02-02 (×4): qty 50

## 2017-02-02 MED ORDER — CHLORHEXIDINE GLUCONATE 0.12% ORAL RINSE (MEDLINE KIT)
15.0000 mL | Freq: Two times a day (BID) | OROMUCOSAL | Status: DC
  Administered 2017-02-02 – 2017-02-04 (×7): 15 mL via OROMUCOSAL

## 2017-02-02 MED ORDER — POTASSIUM CHLORIDE 20 MEQ/15ML (10%) PO SOLN
20.0000 meq | Freq: Once | ORAL | Status: DC
  Filled 2017-02-02: qty 15

## 2017-02-02 MED ORDER — SODIUM CHLORIDE 0.9 % IV SOLN: 250.0000 mL | INTRAVENOUS | Status: DC | PRN

## 2017-02-02 MED ORDER — MIDAZOLAM HCL 2 MG/2ML IJ SOLN: 2.0000 mg | Freq: Once | INTRAMUSCULAR | Status: DC

## 2017-02-02 MED ORDER — AMIODARONE HCL IN DEXTROSE 360-4.14 MG/200ML-% IV SOLN
30.0000 mg/h | INTRAVENOUS | Status: DC
  Administered 2017-02-02 – 2017-02-05 (×6): 30 mg/h via INTRAVENOUS
  Filled 2017-02-02 (×6): qty 200

## 2017-02-02 MED ORDER — FENTANYL 2500MCG IN NS 250ML (10MCG/ML) PREMIX INFUSION
100.0000 ug/h | INTRAVENOUS | Status: DC
  Administered 2017-02-02: 250 ug/h via INTRAVENOUS
  Administered 2017-02-02: 275 ug/h via INTRAVENOUS
  Administered 2017-02-02: 100 ug/h via INTRAVENOUS
  Administered 2017-02-03: 200 ug/h via INTRAVENOUS
  Administered 2017-02-03: 275 ug/h via INTRAVENOUS
  Administered 2017-02-04: 100 ug/h via INTRAVENOUS
  Filled 2017-02-02 (×6): qty 250

## 2017-02-02 MED ORDER — ORAL CARE MOUTH RINSE
15.0000 mL | OROMUCOSAL | Status: DC
  Administered 2017-02-02 – 2017-02-05 (×28): 15 mL via OROMUCOSAL

## 2017-02-02 MED ORDER — DEXTROSE 50 % IV SOLN
INTRAVENOUS | Status: AC
  Administered 2017-02-02: 50 mL
  Filled 2017-02-02: qty 50

## 2017-02-02 MED ORDER — VERAPAMIL HCL 2.5 MG/ML IV SOLN
INTRAVENOUS | Status: AC
  Filled 2017-02-02: qty 2

## 2017-02-02 MED ORDER — MIDAZOLAM HCL 50 MG/10ML IJ SOLN
2.0000 mg/h | INTRAMUSCULAR | Status: DC
  Administered 2017-02-02: 5 mg/h via INTRAVENOUS
  Administered 2017-02-02: 2 mg/h via INTRAVENOUS
  Administered 2017-02-02 – 2017-02-03 (×3): 6 mg/h via INTRAVENOUS
  Filled 2017-02-02 (×6): qty 10

## 2017-02-02 MED ORDER — POTASSIUM CHLORIDE 10 MEQ/100ML IV SOLN
10.0000 meq | INTRAVENOUS | Status: AC
  Administered 2017-02-02 (×3): 10 meq via INTRAVENOUS
  Filled 2017-02-02 (×3): qty 100

## 2017-02-02 MED ORDER — POTASSIUM CHLORIDE 20 MEQ/15ML (10%) PO SOLN
40.0000 meq | Freq: Once | ORAL | Status: AC
  Administered 2017-02-02: 40 meq

## 2017-02-02 MED ORDER — LIDOCAINE HCL (PF) 1 % IJ SOLN
INTRAMUSCULAR | Status: DC | PRN
  Administered 2017-02-02: 15 mL
  Administered 2017-02-02: 2 mL

## 2017-02-02 MED ORDER — CISATRACURIUM BOLUS VIA INFUSION
0.1000 mg/kg | Freq: Once | INTRAVENOUS | Status: DC
  Filled 2017-02-02: qty 6

## 2017-02-02 MED ORDER — SODIUM CHLORIDE 0.9 % WEIGHT BASED INFUSION
0.7500 mL/kg/h | INTRAVENOUS | Status: DC
  Administered 2017-02-02: 0.75 mL/kg/h via INTRAVENOUS

## 2017-02-02 MED ORDER — ONDANSETRON HCL 4 MG/2ML IJ SOLN
4.0000 mg | Freq: Four times a day (QID) | INTRAMUSCULAR | Status: DC | PRN
  Administered 2017-02-05: 4 mg via INTRAVENOUS
  Filled 2017-02-02: qty 2

## 2017-02-02 MED ORDER — HEPARIN SODIUM (PORCINE) 1000 UNIT/ML IJ SOLN
INTRAMUSCULAR | Status: AC
  Filled 2017-02-02: qty 1

## 2017-02-02 MED ORDER — FENTANYL BOLUS VIA INFUSION
50.0000 ug | INTRAVENOUS | Status: DC | PRN
  Administered 2017-02-02 – 2017-02-04 (×2): 50 ug via INTRAVENOUS
  Filled 2017-02-02: qty 50

## 2017-02-02 MED ORDER — FENTANYL CITRATE (PF) 100 MCG/2ML IJ SOLN: 100.0000 ug | Freq: Once | INTRAMUSCULAR | Status: DC

## 2017-02-02 MED ORDER — SODIUM CHLORIDE 0.9% FLUSH: 3.0000 mL | INTRAVENOUS | Status: DC | PRN

## 2017-02-02 MED ORDER — ACETAMINOPHEN 325 MG PO TABS
650.0000 mg | ORAL_TABLET | ORAL | Status: DC | PRN
  Administered 2017-02-04 (×2): 650 mg via ORAL
  Filled 2017-02-02 (×2): qty 2

## 2017-02-02 MED ORDER — NITROGLYCERIN IN D5W 200-5 MCG/ML-% IV SOLN
INTRAVENOUS | Status: AC
  Filled 2017-02-02: qty 250

## 2017-02-02 MED ORDER — IOPAMIDOL (ISOVUE-370) INJECTION 76%
INTRAVENOUS | Status: AC
  Filled 2017-02-02: qty 125

## 2017-02-02 MED ORDER — IOPAMIDOL (ISOVUE-370) INJECTION 76%
INTRAVENOUS | Status: DC | PRN
  Administered 2017-02-02: 80 mL via INTRAVENOUS

## 2017-02-02 MED ORDER — ARTIFICIAL TEARS OPHTHALMIC OINT
1.0000 "application " | TOPICAL_OINTMENT | Freq: Three times a day (TID) | OPHTHALMIC | Status: DC
  Administered 2017-02-02 – 2017-02-03 (×4): 1 via OPHTHALMIC
  Filled 2017-02-02: qty 3.5

## 2017-02-02 MED ORDER — HYDRALAZINE HCL 20 MG/ML IJ SOLN
10.0000 mg | INTRAMUSCULAR | Status: DC | PRN
  Filled 2017-02-02: qty 1

## 2017-02-02 MED ORDER — INSULIN ASPART 100 UNIT/ML ~~LOC~~ SOLN
2.0000 [IU] | SUBCUTANEOUS | Status: DC
  Administered 2017-02-02: 2 [IU] via SUBCUTANEOUS
  Administered 2017-02-02: 6 [IU] via SUBCUTANEOUS
  Administered 2017-02-02: 2 [IU] via SUBCUTANEOUS
  Administered 2017-02-02: 4 [IU] via SUBCUTANEOUS
  Administered 2017-02-03 (×2): 2 [IU] via SUBCUTANEOUS

## 2017-02-02 MED ORDER — LIDOCAINE HCL 2 % IJ SOLN
INTRAMUSCULAR | Status: AC
  Filled 2017-02-02: qty 10

## 2017-02-02 MED ORDER — HEPARIN (PORCINE) IN NACL 2-0.9 UNIT/ML-% IJ SOLN
INTRAMUSCULAR | Status: AC
  Filled 2017-02-02: qty 1000

## 2017-02-02 MED ORDER — MIDAZOLAM BOLUS VIA INFUSION
2.0000 mg | INTRAVENOUS | Status: DC | PRN
Start: 2017-02-01 — End: 2017-02-04
  Filled 2017-02-02: qty 2

## 2017-02-02 MED ORDER — SODIUM CHLORIDE 0.9% FLUSH: 10.0000 mL | INTRAVENOUS | Status: DC | PRN

## 2017-02-02 MED ORDER — CISATRACURIUM BOLUS VIA INFUSION
0.0500 mg/kg | INTRAVENOUS | Status: AC | PRN
  Administered 2017-02-02: 3 mg via INTRAVENOUS
  Filled 2017-02-02: qty 3

## 2017-02-02 MED ORDER — NOREPINEPHRINE BITARTRATE 1 MG/ML IV SOLN
0.0000 ug/min | INTRAVENOUS | Status: DC
  Administered 2017-02-02: 5 ug/min via INTRAVENOUS
  Administered 2017-02-02: 16 ug/min via INTRAVENOUS
  Administered 2017-02-02: 14 ug/min via INTRAVENOUS
  Administered 2017-02-03: 10 ug/min via INTRAVENOUS
  Filled 2017-02-02 (×6): qty 4

## 2017-02-02 MED ORDER — HEPARIN SODIUM (PORCINE) 5000 UNIT/ML IJ SOLN
5000.0000 [IU] | Freq: Three times a day (TID) | INTRAMUSCULAR | Status: DC
  Administered 2017-02-02 – 2017-02-09 (×19): 5000 [IU] via SUBCUTANEOUS
  Filled 2017-02-02 (×18): qty 1

## 2017-02-02 MED ORDER — SODIUM CHLORIDE 0.9% FLUSH
10.0000 mL | Freq: Two times a day (BID) | INTRAVENOUS | Status: DC
  Administered 2017-02-02 (×2): 10 mL

## 2017-02-02 MED ORDER — SODIUM CHLORIDE 0.9 % IV SOLN
1.0000 ug/kg/min | INTRAVENOUS | Status: DC
  Administered 2017-02-02: 1 ug/kg/min via INTRAVENOUS
  Administered 2017-02-03: 1.5 ug/kg/min via INTRAVENOUS
  Filled 2017-02-02 (×2): qty 20

## 2017-02-02 MED ORDER — SODIUM CHLORIDE 0.9 % IV SOLN
INTRAVENOUS | Status: DC
  Administered 2017-02-02 – 2017-02-05 (×2): via INTRAVENOUS

## 2017-02-02 MED ORDER — MIDAZOLAM HCL 2 MG/2ML IJ SOLN
INTRAMUSCULAR | Status: DC | PRN
  Administered 2017-02-02: 2 mg via INTRAVENOUS

## 2017-02-02 MED ORDER — NITROGLYCERIN IN D5W 200-5 MCG/ML-% IV SOLN
INTRAVENOUS | Status: AC | PRN
  Administered 2017-02-02: 10 ug/min via INTRAVENOUS

## 2017-02-02 MED ORDER — CHLORHEXIDINE GLUCONATE CLOTH 2 % EX PADS
6.0000 | MEDICATED_PAD | Freq: Every day | CUTANEOUS | Status: DC
  Administered 2017-02-03 – 2017-02-09 (×9): 6 via TOPICAL

## 2017-02-02 SURGICAL SUPPLY — 13 items
CATH INFINITI 5FR JL4 (CATHETERS) ×1 IMPLANT
CATH INFINITI JR4 5F (CATHETERS) ×1 IMPLANT
COVER PRB 48X5XTLSCP FOLD TPE (BAG) IMPLANT
COVER PROBE 5X48 (BAG) ×2
GLIDESHEATH SLEND A-KIT 6F 22G (SHEATH) ×1 IMPLANT
GUIDEWIRE INQWIRE 1.5J.035X260 (WIRE) IMPLANT
INQWIRE 1.5J .035X260CM (WIRE) ×2
KIT HEART LEFT (KITS) ×2 IMPLANT
PACK CARDIAC CATHETERIZATION (CUSTOM PROCEDURE TRAY) ×2 IMPLANT
SHEATH PINNACLE 5F 10CM (SHEATH) ×1 IMPLANT
TRANSDUCER W/STOPCOCK (MISCELLANEOUS) ×2 IMPLANT
TUBING CIL FLEX 10 FLL-RA (TUBING) ×2 IMPLANT
WIRE .035 3MM-J 145CM (WIRE) ×1 IMPLANT

## 2017-02-02 NOTE — ED Notes (Signed)
Artic sun pads on now.

## 2017-02-02 NOTE — Progress Notes (Addendum)
PULMONARY / CRITICAL CARE MEDICINE   Name: Raven Martinez MRN: [id number] DOB: Mar 25, 1971    ADMISSION DATE:  02/01/2017 CONSULTATION DATE:  02/01/2017  REFERRING MD:  Dr. Jearld Pies  CHIEF COMPLAINT:  Cardiac arrest  HISTORY OF PRESENT ILLNESS:  46 year old female with past medical history significant for hypertension and chronic smoker. She was witnessed to become unresponsive by her boyfriend while at home and previously in her usual state of health. EMS was called and upon arrival noted her to be in ventricular fibrillation. No bystander CPR was administered and EMS promptly began ACLS protocol. He received 5 total defibrillation for return of spontaneous circulation in the emergency department. Total downtime estimated at 20 minutes. Immediately after ROSC she was noted to be moving all 4 extremities, however, she was not moving purposefully. She was intubated for airway protection. She remained in normal sinus rhythm and was hemodynamics stable. She is for cath lab and ICU admission. PCCM asked to evaluate.    SUBJECTIVE:  On hypothermia protocol. Amio drip with sb 49.  VITAL SIGNS: BP (!) 117/93   Pulse 69   Temp (!) 90 F (32.2 C)   Resp (!) 28   Ht  (1.6 m)   Wt 113 lb 12.1 oz (51.6 kg)   LMP 01/03/2017   SpO2 100%   BMI 20.15 kg/m   HEMODYNAMICS: CVP:  [5 mmHg-10 mmHg] 10 mmHg  VENTILATOR SETTINGS: Vent Mode: PRVC FiO2 (%):  [60 %-100 %] 80 % Set Rate:  [20 bmp-28 bmp] 28 bmp Vt Set:  [420 mL] 420 mL PEEP:  [5 cmH20] 5 cmH20 Plateau Pressure:  [16 cmH20] 16 cmH20  INTAKE / OUTPUT: I/O last 3 completed shifts: In: 2008.6 [I.V.:1808.6; IV Piggyback:200] Out: 1125 [Urine:975; Emesis/NG output:150]  PHYSICAL EXAMINATION: General:  Thin female, NMB on board HEENT: MM pink/moist,ET-> vent, PERL 4mm PSY:NMB Neuro: NMB CV: s1s2 rrr, no m/r/g, sb 49 on amio drip PULM: even/non-labored, lungs bilaterally diminished  WU:JWJX, non-tender, bsx4 faint Extremities:  warm/dry, - edema  Skin: no rashes or lesions   LABS:  BMET  Recent Labs Lab 02/01/17 2315 02/02/17 0120 02/02/17 0121 02/02/17 0317 02/02/17 0552  NA 131* 135 139 142 139  K 2.4* 3.1* 3.2* 2.8* 3.8  CL 102 107 105 109 105  CO2 11* 12*  --   --   --   BUN CREATININE 1.07* 0.92 0.80 0.50 0.70  GLUCOSE 345* 208* 210* 62* 152*    Electrolytes  Recent Labs Lab 02/01/17 2315 02/02/17 0120  CALCIUM 7.7* 7.5*  MG  --  1.5*  PHOS  --  3.8    CBC  Recent Labs Lab 02/01/17 2315  02/02/17 0157 02/02/17 0317 02/02/17 0552  WBC 9.9  --  7.6  --   --   HGB 10.4*  < > 10.8* 12.6 11.6*  HCT 31.9*  < > 32.9* 37.0 34.0*  PLT 132*  --  221  --   --   < > = values in this interval not displayed.  Coag's  Recent Labs Lab 02/01/17 2315 02/02/17 0120  APTT  --  30  INR 1.29 1.18    Sepsis Markers  Recent Labs Lab 02/01/17 2317 02/02/17 0246  LATICACIDVEN 11.11* 3.5*    ABG  Recent Labs Lab 02/02/17 0210 02/02/17 0425  PHART 7.106* 7.285*  PCO2ART 45.1 36.7  PO2ART 456* 344*    Liver Enzymes  Recent Labs Lab 02/01/17 2315  AST 38  ALT 15  ALKPHOS 47  BILITOT 0.8  ALBUMIN 3.2*    Cardiac Enzymes  Recent Labs Lab 02/02/17 0120  TROPONINI 0.95*    Glucose  Recent Labs Lab 02/02/17 0302 02/02/17 0358 02/02/17 0414 02/02/17 0507 02/02/17 0551 02/02/17 0649  GLUCAP 108* 48* 247* 160* 152* 169*    Imaging Dg Chest Port 1 View  Result Date: 02/02/2017 CLINICAL DATA:  Central line placement EXAM: PORTABLE CHEST 1 VIEW COMPARISON:  02/01/2017 FINDINGS: Endotracheal tube tip is 6 cm above the carina. Enteric tube extends well into the stomach and beyond the inferior edge of the image. New left jugular central line terminates in the low SVC. No pneumothorax.  The lungs are clear.  No large effusion. IMPRESSION: 1.  Support equipment appears satisfactorily positioned. 2. New left jugular central line is satisfactorily  position. No pneumothorax. 3. No consolidation or effusion. Electronically Signed   By: Ellery Plunk M.D.   On: 02/02/2017 02:26   Dg Chest Portable 1 View  Result Date: 02/01/2017 CLINICAL DATA:  Cardiac arrest. EXAM: PORTABLE CHEST 1 VIEW COMPARISON:  01/15/2017 FINDINGS: Endotracheal tube tip is 6.7 cm above the carina. Enteric tube extends into the stomach and beyond the inferior edge of the image. No pneumothorax.  No large effusion.  No airspace consolidation. IMPRESSION: 1.  Support equipment appears satisfactorily positioned. 2. No consolidation or effusion. Electronically Signed   By: Ellery Plunk M.D.   On: 02/01/2017 23:59   Dg Abd Portable 1 View  Result Date: 02/02/2017 CLINICAL DATA:  Tube placement EXAM: PORTABLE ABDOMEN - 1 VIEW COMPARISON:  None. FINDINGS: Enteric tube extends well into the stomach with tip in the region of the distal gastric body. Visible portions of the abdominal gas pattern are unremarkable. IMPRESSION: Enteric tube extends well into the stomach. Electronically Signed   By: Ellery Plunk M.D.   On: 02/02/2017 00:00    STUDIES:  10/10 left heart cath >ef 35%, NL coronary art.  CULTURES:   ANTIBIOTICS:   SIGNIFICANT EVENTS: 10/9 > 20 min VT/VF arrest. Cath lab, Cooling started 10/10 0030, temp goal reached 0230 10/10  LINES/TUBES: ETT 10/10 >  DISCUSSION: 46 year old female who suffered a witnessed cardiac arrest 10/9. VT/VF resolved with shocks. Estimated 20 minutes in duration. Intubated in emergency department taken emergently to the cardiac catheterization lab. Therapeutic hypothermia initiated at [facility].  ASSESSMENT / PLAN:  PULMONARY A: Inability to protect airway status post cardiac arrest Tobacco abuse disorder  P:    Full vent support ABG Ventilator associated pneumonia protocol Chest x-ray  No weaning trials until rewarmed Increased rr to offf set meatbolic acidosis   CARDIOVASCULAR A:  Cardiac arrest: VT/VF 20  minute downtime  H/o HTN  P:  Hemodynamic monitoring in the ICU setting cardiology admitting Emergently to cath lab, ef 35%, NL coronary arteries  Trend troponin Trend lactic acid Will need echo Targeted temperature management 33C Amio for ventricular ectopics, consider dc drip with sb 49  RENAL Lab Results  Component Value Date   CREATININE 0.70 02/02/2017   CREATININE 0.50 02/02/2017   CREATININE 0.80 02/02/2017    Recent Labs Lab 02/02/17 0121 02/02/17 0317 02/02/17 0552  K 3.2* 2.8* 3.8     Recent Labs Lab 02/02/17 0121 02/02/17 0317 02/02/17 0552  NA 139 142 139    A:   Hypokalemia Acute kidney injury Lactic acidosis  P:   BMP every 2 hours 40 mEq of IV potassium, 20 mEq of KCl via tube Strict I&O  Magnesium Phosphorus Monitor lactic acid  GASTROINTESTINAL A:   No acute issues P:   Nothing by mouth Protonix  HEMATOLOGIC  Recent Labs  02/02/17 0317 02/02/17 0552  HGB 12.6 11.6*    A:   Anemia P:  Follow CBC Anticoagulation/antiplatelet per cardiology  INFECTIOUS A:   No acute issues P:   Monitor temp curve and wbc  ENDOCRINE CBG (last 3)   Recent Labs  02/02/17 0507 02/02/17 0551 02/02/17 0649  GLUCAP 160* 152* 169*     A:   Hyperglycemia with no history of diabetes mellitus P:   CBG monitoring and sliding scale insulin Will need basal insulin when on tf  NEUROLOGIC A:   Acute anoxic encephalopathy, 10/10 in NMB for hypothermia protocol P:   RASS goal:  -4 to -5 Intravenous fentanyl and Versed CT head once stable EEG to rule out nonconvulsive status  FAMILY  - Updates: Large family including son and daughter updated in consultation room in the emergency department. 10/10 no family at bedside  - Inter-disciplinary family meet or Palliative Care meeting due by:  October 18  -App CCT 45 min   Brett Canales Minor ACNP Adolph Pollack PCCM Pager [phone] till 3 pm If no answer page [phone] 02/02/2017, 8:06  AM  Attending Note:  46 year old female with little known PMH presenting with VF arrest for unknown reason.  Cardiology following.  On exam, sedated and paralyzed.  Lungs are clear to auscultation.  EEG is being done.  I reviewed CXR myself, ETT is in good position.  Will continue hypothermia protocol for now.  Will need neurology's involvement pending neuro condition post warming.  Continue pressor support for now.  Accept bradycardia given hypothermia.    The patient is critically ill with multiple organ systems failure and requires high complexity decision making for assessment and support, frequent evaluation and titration of therapies, application of advanced monitoring technologies and extensive interpretation of multiple databases.   Critical Care Time devoted to patient care services described in this note is  55  Minutes. This time reflects time of care of this signee Dr Koren Bound. This critical care time does not reflect procedure time, or teaching time or supervisory time of PA/NP/Med student/Med Resident etc but could involve care discussion time.  Alyson Reedy, M.D. [facility] Pulmonary/Critical Care Medicine. Pager: [phone]. After hours pager: [phone].

## 2017-02-02 NOTE — Progress Notes (Signed)
Inpatient Diabetes Program Recommendations  AACE/ADA: New Consensus Statement on Inpatient Glycemic Control (2015)  Target Ranges:  Prepandial:   less than 140 mg/dL      Peak postprandial:   less than 180 mg/dL (1-2 hours)      Critically ill patients:  140 - 180 mg/dL   Lab Results  Component Value Date   GLUCAP 169 (H) 02/02/2017    Review of Glycemic ControlResults for Raven Martinez, Raven Martinez (MRN [id number]) as of 02/02/2017 12:17  Ref. Range 02/02/2017 03:02 02/02/2017 03:58 02/02/2017 04:14 02/02/2017 05:07 02/02/2017 05:51 02/02/2017 06:49 02/02/2017 08:16  Glucose-Capillary Latest Ref Range: 65 - 99 mg/dL 010 (H) 48 (L) 272 (H) 160 (H) 152 (H) 169 (H) 169 (H)   Inpatient Diabetes Program Recommendations:    Please reduce Novolog correction to sensitive (1-2-3) q 4 hours.    Thanks, Beryl Meager, RN, BC-ADM Inpatient Diabetes Coordinator Pager [phone] (8a-5p)

## 2017-02-02 NOTE — Progress Notes (Signed)
eLink Physician-Brief Progress Note Patient Name: Raven Martinez DOB: 05-Oct-1970 MRN: [id number]   Date of Service  02/02/2017  HPI/Events of Note  Hypokalemia  eICU Interventions  Potassium replaced     Intervention Category Intermediate Interventions: Electrolyte abnormality - evaluation and management  DETERDING,ELIZABETH 02/02/2017, 9:16 PM

## 2017-02-02 NOTE — ED Notes (Signed)
To cath lab now 

## 2017-02-02 NOTE — Progress Notes (Signed)
Initial Nutrition Assessment  DOCUMENTATION CODES:   Not applicable  INTERVENTION:  Once rewarmed and medically appropriate, recommed initiation of Vital AF 1.2 TF at a rate of 50 ml/hr (1200 ml per day)  Tube feeding regimen provides 1440 kcal, 90 grams of protein, and 972 ml of H2O.   NUTRITION DIAGNOSIS:   Inadequate oral intake related to inability to eat as evidenced by NPO status.  GOAL:   Patient will meet greater than or equal to 90% of their needs  MONITOR:   Weight trends, I & O's, Vent status, Labs, Diet advancement  REASON FOR ASSESSMENT:   Ventilator    ASSESSMENT:   Pt with no pertinent PMH presents s/p cardiac arrest. Pt was intubated to protect her airway and hypothermia protocol was initiated.    Discussed pt with RN.  No family at bedside. Patient is currently intubated on ventilator support MV: 11.8 L/min Temp (24hrs), Avg:91.9 F (33.3 C), Min:88.7 F (31.5 C), Max:97.4 F (36.3 C) Propofol off  Unable to complete nutrition focused physical exam at this time.   Labs reviewed; CBG 135-247, Phosphorus 1.8, Magnesium 1.3 Medications reviewed; sliding scale insulin, Protonix, potassium chloride, sodium bicarbonate in 50 ml D5   Diet Order:  Diet NPO time specified  Skin:  Reviewed, no issues  Last BM:  unknown BM date  Height:   Ht Readings from Last 1 Encounters:  02/02/17  (1.6 m)    Weight:   Wt Readings from Last 1 Encounters:  02/02/17 113 lb 12.1 oz (51.6 kg)    Ideal Body Weight:  52.3 kg  BMI:  Body mass index is 20.15 kg/m.  Estimated Nutritional Needs:   Kcal:  1411   Protein:  75-90 grams  Fluid:  >/= 1.4 L/d  EDUCATION NEEDS:   No education needs identified at this time  Fransisca Kaufmann, MS, RDN, LDN 02/02/2017 3:38 PM

## 2017-02-02 NOTE — Progress Notes (Signed)
EEG complete - results pending 

## 2017-02-02 NOTE — Consult Note (Signed)
PULMONARY / CRITICAL CARE MEDICINE   Name: Raven Martinez MRN: [id number] DOB: 08-Jun-1970    ADMISSION DATE:  02/01/2017 CONSULTATION DATE:  02/01/2017  REFERRING MD:  Dr. Jearld Pies  CHIEF COMPLAINT:  Cardiac arrest  HISTORY OF PRESENT ILLNESS:  46 year old female with past medical history significant for hypertension and chronic smoker. She was witnessed to become unresponsive by her boyfriend while at home and previously in her usual state of health. EMS was called and upon arrival noted her to be in ventricular fibrillation. No bystander CPR was administered and EMS promptly began ACLS protocol. He received 5 total defibrillation for return of spontaneous circulation in the emergency department. Total downtime estimated at 20 minutes. Immediately after ROSC she was noted to be moving all 4 extremities, however, she was not moving purposefully. She was intubated for airway protection. She remained in normal sinus rhythm and was hemodynamics stable. She is for cath lab and ICU admission. PCCM asked to evaluate.   PAST MEDICAL HISTORY :  She  has no past medical history on file.  PAST SURGICAL HISTORY: She  has a past surgical history that includes Cesarean section.  No Known Allergies  No current facility-administered medications on file prior to encounter.    Current Outpatient Prescriptions on File Prior to Encounter  Medication Sig  . cyclobenzaprine (FLEXERIL) 10 MG tablet Take 1 tablet (10 mg total) by mouth 3 (three) times daily.  . Naproxen Sodium (ALEVE PO) Take by mouth.    FAMILY HISTORY:  Her has no family status information on file.    SOCIAL HISTORY: She  reports that she has been smoking.  She does not have any smokeless tobacco history on file. She reports that she drinks alcohol. She reports that she uses drugs, including Marijuana.  REVIEW OF SYSTEMS:   Unable as patient is intubated and encephalopathic  SUBJECTIVE:    VITAL SIGNS: BP (!) 162/102   Pulse  100   Temp (!) 97.4 F (36.3 C) (Rectal)   Resp (!) 22   Ht  (1.6 m)   Wt 60 kg (132 lb 4.4 oz)   LMP 01/03/2017   SpO2 100%   BMI 23.43 kg/m   HEMODYNAMICS:    VENTILATOR SETTINGS: Vent Mode: PRVC FiO2 (%):  [60 %] 60 % Set Rate:  [20 bmp] 20 bmp Vt Set:  [420 mL] 420 mL PEEP:  [5 cmH20] 5 cmH20  INTAKE / OUTPUT: No intake/output data recorded.  PHYSICAL EXAMINATION: General:  Middle-aged African-American female of normal body habitus Neuro:  Unresponsive, PERRL HEENT:   normocephalic, atraumatic, moderate JVD Cardiovascular:   RRR, no MRG Lungs:   clear bilateral breath sounds Abdomen:   soft, nontender, nondistended Musculoskeletal:   no acute deformities Skin:   grossly intact  LABS:  BMET  Recent Labs Lab 02/01/17 2315  NA 131*  K 2.4*  CL 102  CO2 11*  BUN 10  CREATININE 1.07*  GLUCOSE 345*    Electrolytes  Recent Labs Lab 02/01/17 2315  CALCIUM 7.7*    CBC  Recent Labs Lab 02/01/17 2315  WBC 9.9  HGB 10.4*  HCT 31.9*  PLT 132*    Coag's  Recent Labs Lab 02/01/17 2315  INR 1.29    Sepsis Markers  Recent Labs Lab 02/01/17 2317  LATICACIDVEN 11.11*    ABG No results for input(s): PHART, PCO2ART, PO2ART in the last 168 hours.  Liver Enzymes  Recent Labs Lab 02/01/17 2315  AST 38  ALT 15  ALKPHOS 47  BILITOT 0.8  ALBUMIN 3.2*    Cardiac Enzymes No results for input(s): TROPONINI, PROBNP in the last 168 hours.  Glucose No results for input(s): GLUCAP in the last 168 hours.  Imaging Dg Chest Portable 1 View  Result Date: 02/01/2017 CLINICAL DATA:  Cardiac arrest. EXAM: PORTABLE CHEST 1 VIEW COMPARISON:  01/15/2017 FINDINGS: Endotracheal tube tip is 6.7 cm above the carina. Enteric tube extends into the stomach and beyond the inferior edge of the image. No pneumothorax.  No large effusion.  No airspace consolidation. IMPRESSION: 1.  Support equipment appears satisfactorily positioned. 2. No consolidation  or effusion. Electronically Signed   By: Ellery Plunk M.D.   On: 02/01/2017 23:59   Dg Abd Portable 1 View  Result Date: 02/02/2017 CLINICAL DATA:  Tube placement EXAM: PORTABLE ABDOMEN - 1 VIEW COMPARISON:  None. FINDINGS: Enteric tube extends well into the stomach with tip in the region of the distal gastric body. Visible portions of the abdominal gas pattern are unremarkable. IMPRESSION: Enteric tube extends well into the stomach. Electronically Signed   By: Ellery Plunk M.D.   On: 02/02/2017 00:00    STUDIES:  10/10 left heart cath >  CULTURES:   ANTIBIOTICS:   SIGNIFICANT EVENTS: 10/9 > 20 min VT/VF arrest. Cath lab, Cooling started 10/10 0030  LINES/TUBES: ETT 10/10 >  DISCUSSION: 46 year old female who suffered a witnessed cardiac arrest 10/9. VT/VF resolved with shocks. Estimated 20 minutes in duration. Intubated in emergency department taken emergently to the cardiac catheterization lab. Therapeutic hypothermia initiated at [facility].  ASSESSMENT / PLAN:  PULMONARY A: Inability to protect airway status post cardiac arrest Tobacco abuse disorder  P:    Full vent support ABG Ventilator associated pneumonia protocol Chest x-ray in morning No weaning trials until rewarmed  CARDIOVASCULAR A:  Cardiac arrest: VT/VF 20 minute downtime  H/o HTN  P:  Hemodynamic monitoring in the ICU setting cardiology admitting Emergently to cath lab Trend troponin Trend lactic acid Will need echo Targeted temperature management 33C  RENAL A:   Hypokalemia Acute kidney injury Lactic acidosis  P:   BMP every 2 hours 40 mEq of IV potassium, 20 mEq of KCl via tube Strict I&O Magnesium Phosphorus  GASTROINTESTINAL A:   No acute issues P:   Nothing by mouth Protonix  HEMATOLOGIC A:   Anemia P:  Follow CBC Anticoagulation/antiplatelet per cardiology  INFECTIOUS A:   No acute issues P:     ENDOCRINE A:   Hyperglycemia with no history of diabetes  mellitus P:   CBG monitoring and sliding scale insulin  NEUROLOGIC A:   Acute anoxic encephalopathy P:   RASS goal:  -4 to -5 Intravenous fentanyl and Versed CT head once EEG to rule out nonconvulsive status  FAMILY  - Updates: Large family including son and daughter updated in consultation room in the emergency department.  - Inter-disciplinary family meet or Palliative Care meeting due by:  October 18   Joneen Roach, AGACNP-BC [facility] Pulmonology/Critical Care Pager [phone] or [phone]  02/02/2017 12:41 AM

## 2017-02-02 NOTE — Procedures (Signed)
Central Venous Catheter Insertion Procedure Note NOCOLE ZAMMIT [id number] [birth year]  Procedure: Insertion of Central Venous Catheter Indications: Assessment of intravascular volume, Drug and/or fluid administration and Frequent blood sampling  Procedure Details Consent: Risks of procedure as well as the alternatives and risks of each were explained to the (patient/caregiver).  Consent for procedure obtained. Time Out: Verified patient identification, verified procedure, site/side was marked, verified correct patient position, special equipment/implants available, medications/allergies/relevent history reviewed, required imaging and test results available.  Performed  Maximum sterile technique was used including antiseptics, cap, gloves, gown, hand hygiene, mask and sheet. Skin prep: Chlorhexidine; local anesthetic administered A antimicrobial bonded/coated triple lumen catheter was placed in the left internal jugular vein using the Seldinger technique. Ultrasound guidance used.Yes.   Catheter placed to 20 cm. Blood aspirated via all 3 ports and then flushed x 3. Line sutured x 2 and dressing applied.  Evaluation Blood flow good Complications: No apparent complications Patient did tolerate procedure well. Chest X-ray ordered to verify placement.  CXR: pending.  Joneen Roach, AGACNP-BC [facility] Pulmonology/Critical Care Pager [phone] or [phone]  02/02/2017 1:51 AM

## 2017-02-02 NOTE — Progress Notes (Signed)
Pt is now in the cath lab

## 2017-02-02 NOTE — Progress Notes (Signed)
eLink Physician-Brief Progress Note Patient Name: NEHEMIE CASSERLY DOB: [birth year] MRN: [id number]   Date of Service  02/02/2017  HPI/Events of Note    eICU Interventions  Amiodarone gtt continued after initial bolus from the ED     Intervention Category Intermediate Interventions: Arrhythmia - evaluation and management  BYRUM,ROBERT S. 02/02/2017, 2:29 AM

## 2017-02-02 NOTE — Procedures (Signed)
ELECTROENCEPHALOGRAM REPORT  Date of Study: 02/02/2017  Patient's Name: Raven Martinez MRN: [id number] Date of Birth: 09/14/1970  Referring Provider: Georgann Housekeeper, NP  Clinical History: This is a 46 year old woman s/p cardiac arrest on hypothermia protocol.  Medications: midazolam (VERSED) 50 mg in sodium chloride 0.9 % 50 mL (1 mg/mL) infusion  fentaNYL 2528mg in NS 258m(1052mml) infusion-PREMIX  acetaminophen (TYLENOL) tablet 650 mg  amiodarone (NEXTERONE PREMIX) 360-4.14 MG/200ML-% (1.8 mg/mL) IV infusion  artificial tears (LACRILUBE) ophthalmic ointment 1 application  chlorhexidine gluconate (MEDLINE KIT) (PERIDEX) 0.12 % solution 15 mL  cisatracurium (NIMBEX) 200 mg in sodium chloride 0.9 % 200 mL (1 mg/mL) infusion  heparin injection 5,000 Units  hydrALAZINE (APRESOLINE) injection 10-20 mg  insulin aspart (novoLOG) injection 2-6 Units  norepinephrine (LEVOPHED) 4 mg in dextrose 5 % 250 mL (0.016 mg/mL) infusion  ondansetron (ZOFRAN) injection 4 mg  pantoprazole (PROTONIX) injection 40 mg  potassium chloride 20 MEQ/15ML (10%) solution 20 mEq   Technical Summary: A multichannel digital EEG recording measured by the international 10-20 system with electrodes applied with paste and impedances below 5000 ohms performed in our laboratory with EKG monitoring in an intubated and sedated patient on hypothermia protocol at 33.8 degrees Celsius.  Hyperventilation and photic stimulation were not performed.  The digital EEG was referentially recorded, reformatted, and digitally filtered in a variety of bipolar and referential montages for optimal display.    Description: The patient is intubated and sedated on Fentanyl and Versed during the recording. There is loss of normal background activity. The records read at a sensitivity of 3 uV/mm shows diffuse suppression of background activity with very low voltage delta slowing and beta activity seen. There is no spontaneous reactivity or  reactivity noted with noxious stimulation.There were no epileptiform discharges or electrographic seizures seen.   EKG lead was unremarkable.  Impression: This EEG is markedly abnormal due to diffuse background suppression and lack of EEG reactivity with noxious stimulation.  Clinical Correlation of the above findings indicates severe diffuse cerebral dysfunction that is non-specific in etiology and can be seen in the setting of anoxic/ischemic injury, toxic/metabolic encephalopathies, or medication effect from Versed and Fentanyl. There were no electrographic seizures in this study. Clinical correlation is advised.  KarEllouise Newer.D.

## 2017-02-02 NOTE — Interval H&P Note (Signed)
Cath Lab Visit (complete for each Cath Lab visit)  Clinical Evaluation Leading to the Procedure:   ACS: No.  Non-ACS:    Anginal Classification: No Symptoms  Anti-ischemic medical therapy: No Therapy  Non-Invasive Test Results: No non-invasive testing performed  Prior CABG: No previous CABG      History and Physical Interval Note:  02/02/2017 12:27 AM  Raven Martinez  has presented today for surgery, with the diagnosis of STEMI  The various methods of treatment have been discussed with the patient and family. After consideration of risks, benefits and other options for treatment, the patient has consented to  Procedure(s): LEFT HEART CATH AND CORONARY ANGIOGRAPHY (N/A) as a surgical intervention .  The patient's history has been reviewed, patient examined, no change in status, stable for surgery.  I have reviewed the patient's chart and labs.  Questions were answered to the patient's satisfaction.     Lyn Records III

## 2017-02-02 NOTE — Progress Notes (Signed)
CRITICAL VALUE ALERT  Critical Value:  Troponin 0.95  Date & Time Notied:  02/02/17 @ 0300  Provider Notified: Dr. Delton Coombes   Orders Received/Actions taken: no new orders at this time

## 2017-02-02 NOTE — Progress Notes (Signed)
Hypoglycemic Event  CBG: 48  Treatment: D50 IV 50 mL  Symptoms: unable to assess, pt intubated, sedated and paralyzed  Follow-up CBG: Time:0414 CBG Result:247  Possible Reasons for Event: Inadequate meal intake  Comments/MD notified: yes, Dr. Delton Coombes aware and to assess IV fluids    Tarique Loveall, Reather Littler

## 2017-02-02 NOTE — Care Management Note (Addendum)
Case Management Note  Patient Details  Name: Raven Martinez MRN: [id number] Date of Birth: [birth year]  Subjective/Objective:   From home,presents with Cardiac Arrest, witnessed by boyfriend,  Downtime estimated 20 mins per MD note.  She is on full vent with hypothermia protocol.  She has a sister Scarlette Calico WUJWJ [phone] and she has a son who is 47 yo,and a daughter who is 78 yo.      10/12 1518 Letha Cape RN, BSN - conts on the vent.                 Action/Plan: NCM will follow progression.  Expected Discharge Date:                  Expected Discharge Plan:     In-House Referral:     Discharge planning Services  CM Consult  Post Acute Care Choice:    Choice offered to:     DME Arranged:    DME Agency:     HH Arranged:    HH Agency:     Status of Service:  In process, will continue to follow  If discussed at Long Length of Stay Meetings, dates discussed:    Additional Comments:  Leone Haven, RN 02/02/2017, 2:28 PM

## 2017-02-02 NOTE — Progress Notes (Signed)
CRITICAL VALUE ALERT  Critical Value:  Lactic acid 3.5  Date & Time Notied:  02/02/17 @ 0340  Provider Notified: Dr. Delton Coombes  Orders Received/Actions taken: no new orders at this time, trending down

## 2017-02-02 NOTE — Progress Notes (Signed)
Patient bought in following CPR. She  Is critical. Sat with family in Consult Room til Dr could come and give report. Moving her up to Cath unit and 24-48 hours will know more.  Patients children upset.  Phebe Colla, Chaplain   02/02/17 0000  Clinical Encounter Type  Visited With Family  Visit Type Spiritual support  Spiritual Encounters  Spiritual Needs Emotional

## 2017-02-03 ENCOUNTER — Inpatient Hospital Stay (HOSPITAL_COMMUNITY): Payer: Medicaid Other

## 2017-02-03 LAB — CBC
HEMATOCRIT: 26.8 % — AB (ref 36.0–46.0)
HEMOGLOBIN: 9.5 g/dL — AB (ref 12.0–15.0)
MCH: 26.6 pg (ref 26.0–34.0)
MCHC: 35.4 g/dL (ref 30.0–36.0)
MCV: 75.1 fL — ABNORMAL LOW (ref 78.0–100.0)
Platelets: 109 10*3/uL — ABNORMAL LOW (ref 150–400)
RBC: 3.57 MIL/uL — AB (ref 3.87–5.11)
RDW: 15.7 % — ABNORMAL HIGH (ref 11.5–15.5)
WBC: 7.8 10*3/uL (ref 4.0–10.5)

## 2017-02-03 LAB — BASIC METABOLIC PANEL
ANION GAP: 5 (ref 5–15)
Anion gap: 7 (ref 5–15)
Anion gap: 7 (ref 5–15)
Anion gap: 8 (ref 5–15)
Anion gap: 9 (ref 5–15)
BUN: 11 mg/dL (ref 6–20)
BUN: 12 mg/dL (ref 6–20)
BUN: 12 mg/dL (ref 6–20)
BUN: 12 mg/dL (ref 6–20)
BUN: 13 mg/dL (ref 6–20)
CALCIUM: 6.7 mg/dL — AB (ref 8.9–10.3)
CALCIUM: 7.1 mg/dL — AB (ref 8.9–10.3)
CHLORIDE: 102 mmol/L (ref 101–111)
CHLORIDE: 101 mmol/L (ref 101–111)
CO2: 21 mmol/L — ABNORMAL LOW (ref 22–32)
CO2: 22 mmol/L (ref 22–32)
CO2: 24 mmol/L (ref 22–32)
CO2: 25 mmol/L (ref 22–32)
CO2: 25 mmol/L (ref 22–32)
CREATININE: 0.63 mg/dL (ref 0.44–1.00)
Calcium: 6.5 mg/dL — ABNORMAL LOW (ref 8.9–10.3)
Calcium: 6.9 mg/dL — ABNORMAL LOW (ref 8.9–10.3)
Calcium: 6.9 mg/dL — ABNORMAL LOW (ref 8.9–10.3)
Chloride: 100 mmol/L — ABNORMAL LOW (ref 101–111)
Chloride: 106 mmol/L (ref 101–111)
Chloride: 106 mmol/L (ref 101–111)
Creatinine, Ser: 0.64 mg/dL (ref 0.44–1.00)
Creatinine, Ser: 0.81 mg/dL (ref 0.44–1.00)
Creatinine, Ser: 0.9 mg/dL (ref 0.44–1.00)
Creatinine, Ser: 1.15 mg/dL — ABNORMAL HIGH (ref 0.44–1.00)
GFR calc Af Amer: >60 mL/min (ref >60)
GFR calc Af Amer: >60 mL/min (ref >60)
GFR calc Af Amer: >60 mL/min (ref >60)
GFR calc Af Amer: >60 mL/min (ref >60)
GFR calc non Af Amer: >60 mL/min (ref >60)
GFR calc non Af Amer: >60 mL/min (ref >60)
GFR, EST NON AFRICAN AMERICAN: 56 mL/min — AB (ref >60)
GLUCOSE: 112 mg/dL — AB (ref 65–99)
GLUCOSE: 121 mg/dL — AB (ref 65–99)
GLUCOSE: 127 mg/dL — AB (ref 65–99)
GLUCOSE: 138 mg/dL — AB (ref 65–99)
GLUCOSE: 97 mg/dL (ref 65–99)
POTASSIUM: 4 mmol/L (ref 3.5–5.1)
POTASSIUM: 3.6 mmol/L (ref 3.5–5.1)
POTASSIUM: 3.3 mmol/L — AB (ref 3.5–5.1)
Potassium: 3.7 mmol/L (ref 3.5–5.1)
Potassium: 4.2 mmol/L (ref 3.5–5.1)
SODIUM: 134 mmol/L — AB (ref 135–145)
Sodium: 133 mmol/L — ABNORMAL LOW (ref 135–145)
Sodium: 135 mmol/L (ref 135–145)
Sodium: 133 mmol/L — ABNORMAL LOW (ref 135–145)
Sodium: 133 mmol/L — ABNORMAL LOW (ref 135–145)

## 2017-02-03 LAB — BLOOD GAS, ARTERIAL
ACID-BASE DEFICIT: 0.6 mmol/L (ref 0.0–2.0)
BICARBONATE: 22.7 mmol/L (ref 20.0–28.0)
Drawn by: 511471
FIO2: 40
LHR: 28 {breaths}/min
MECHVT: 420 mL
O2 SAT: 99.3 %
PATIENT TEMPERATURE: 92.5
PCO2 ART: 27.3 mmHg — AB (ref 32.0–48.0)
PEEP/CPAP: 5 cmH2O
PH ART: 7.514 — AB (ref 7.350–7.450)
PO2 ART: 145 mmHg — AB (ref 83.0–108.0)

## 2017-02-03 LAB — POCT I-STAT 3, ART BLOOD GAS (G3+)
Acid-base deficit: 2 mmol/L (ref 0.0–2.0)
BICARBONATE: 22.9 mmol/L (ref 20.0–28.0)
O2 Saturation: 98 %
PCO2 ART: 36.3 mmHg (ref 32.0–48.0)
PO2 ART: 91 mmHg (ref 83.0–108.0)
TCO2: 24 mmol/L (ref 22–32)
pH, Arterial: 7.4 (ref 7.350–7.450)

## 2017-02-03 LAB — GLUCOSE, CAPILLARY
GLUCOSE-CAPILLARY: 115 mg/dL — AB (ref 65–99)
Glucose-Capillary: 104 mg/dL — ABNORMAL HIGH (ref 65–99)
Glucose-Capillary: 125 mg/dL — ABNORMAL HIGH (ref 65–99)
Glucose-Capillary: 149 mg/dL — ABNORMAL HIGH (ref 65–99)
Glucose-Capillary: 93 mg/dL (ref 65–99)

## 2017-02-03 MED ORDER — MAGNESIUM SULFATE 4 GM/100ML IV SOLN
4.0000 g | Freq: Once | INTRAVENOUS | Status: AC
  Administered 2017-02-03: 4 g via INTRAVENOUS
  Filled 2017-02-03: qty 100

## 2017-02-03 MED ORDER — POTASSIUM CHLORIDE 20 MEQ/15ML (10%) PO SOLN
40.0000 meq | Freq: Once | ORAL | Status: AC
  Administered 2017-02-03: 40 meq
  Filled 2017-02-03: qty 30

## 2017-02-03 MED ORDER — POTASSIUM CHLORIDE 20 MEQ/15ML (10%) PO SOLN: 40.0000 meq | Freq: Once | ORAL | Status: DC

## 2017-02-03 MED FILL — Lidocaine HCl Local Inj 2%: INTRAMUSCULAR | Qty: 10 | Status: AC

## 2017-02-03 MED FILL — Verapamil HCl IV Soln 2.5 MG/ML: INTRAVENOUS | Qty: 2 | Status: AC

## 2017-02-03 MED FILL — Heparin Sodium (Porcine) Inj 1000 Unit/ML: INTRAMUSCULAR | Qty: 10 | Status: AC

## 2017-02-03 NOTE — Progress Notes (Signed)
PULMONARY / CRITICAL CARE MEDICINE   Name: Raven Martinez MRN: [id number] DOB: [birth year]    ADMISSION DATE:  02/01/2017 CONSULTATION DATE:  02/01/2017  REFERRING MD:  Dr. Jearld Pies  CHIEF COMPLAINT:  Cardiac arrest  HISTORY OF PRESENT ILLNESS:  46 year old female with past medical history significant for hypertension and chronic smoker. She was witnessed to become unresponsive by her boyfriend while at home and previously in her usual state of health. EMS was called and upon arrival noted her to be in ventricular fibrillation. No bystander CPR was administered and EMS promptly began ACLS protocol. He received 5 total defibrillation for return of spontaneous circulation in the emergency department. Total downtime estimated at 20 minutes. Immediately after ROSC she was noted to be moving all 4 extremities, however, she was not moving purposefully. She was intubated for airway protection. She remained in normal sinus rhythm and was hemodynamics stable. She is for cath lab and ICU admission. PCCM asked to evaluate.    SUBJECTIVE:  On hypothermia protocol. Amio drip with sb 53, warming.  VITAL SIGNS: BP 120/82   Pulse (!) 52   Temp (!) 94.5 F (34.7 C) (Core (Comment))   Resp 20   Ht  (1.6 m)   Wt 113 lb 12.1 oz (51.6 kg)   SpO2 100%   BMI 20.15 kg/m   HEMODYNAMICS: CVP:  [5 mmHg-10 mmHg] 5 mmHg  VENTILATOR SETTINGS: Vent Mode: PRVC FiO2 (%):  [30 %-80 %] 30 % Set Rate:  [20 bmp-28 bmp] 20 bmp Vt Set:  [420 mL] 420 mL PEEP:  [5 cmH20] 5 cmH20 Plateau Pressure:  [16 cmH20-18 cmH20] 17 cmH20  INTAKE / OUTPUT: I/O last 3 completed shifts: In: 5575.4 [I.V.:5175.4; IV Piggyback:400] Out: 1915 [Urine:1715; Emesis/NG output:200]  PHYSICAL EXAMINATION: General:  Thin ill appearing female HEENT: MM pink/moist,ET-> vent PSY:NMB Neuro: NMB CV: s1s2 rrr, no m/r/g SB 53 on amio drip PULM: even/non-labored, lungs bilaterally decreased in bases WU:JWJX, non-tender, bsx4 faint   Extremities: warm/dry,-edema  Skin: no rashes or lesions    LABS:  BMET  Recent Labs Lab 02/02/17 2353 02/03/17 0337 02/03/17 0735  NA 134* 133* 135  K 3.7 4.0 3.6  CL 106 106 102  CO2 21* 22 24  BUN CREATININE 0.63 0.64 0.81  GLUCOSE 121* 97 138*    Electrolytes  Recent Labs Lab 02/02/17 0120 02/02/17 1020  02/02/17 2353 02/03/17 0337 02/03/17 0735  CALCIUM 7.5*  --   < > 6.9* 6.7* 6.9*  MG 1.5* 1.3*  --   --   --   --   PHOS 3.8 1.8*  --   --   --   --   < > = values in this interval not displayed.  CBC  Recent Labs Lab 02/01/17 2315  02/02/17 0157  02/02/17 0817 02/02/17 1155 02/03/17 0337  WBC 9.9  --  7.6  --   --   --  7.8  HGB 10.4*  < > 10.8*  < > 11.9* 11.9* 9.5*  HCT 31.9*  < > 32.9*  < > 35.0* 35.0* 26.8*  PLT 132*  --  221  --   --   --  109*  < > = values in this interval not displayed.  Coag's  Recent Labs Lab 02/01/17 2315 02/02/17 0120 02/02/17 1020  APTT  --  30 35  INR 1.29 1.18 1.15    Sepsis Markers  Recent Labs Lab 02/02/17 0246 02/02/17 0900 02/02/17 1150  LATICACIDVEN 3.5* 1.6 1.8    ABG  Recent Labs Lab 02/02/17 0425 02/02/17 1152 02/03/17 0545  PHART 7.285* 7.382 7.514*  PCO2ART 36.7 31.1* 27.3*  PO2ART 344* 340.0* 145*    Liver Enzymes  Recent Labs Lab 02/01/17 2315  AST 38  ALT 15  ALKPHOS 47  BILITOT 0.8  ALBUMIN 3.2*    Cardiac Enzymes  Recent Labs Lab 02/02/17 1020 02/02/17 1502 02/02/17 1949  TROPONINI 1.33* 0.92* 0.56*    Glucose  Recent Labs Lab 02/02/17 1142 02/02/17 1602 02/02/17 1851 02/02/17 1954 02/02/17 2339 02/03/17 0346  GLUCAP 135* 111* 103* 108* 126* 104*    Imaging Dg Chest Port 1 View  Result Date: 02/03/2017 CLINICAL DATA:  Respiratory failure.  Endotracheal tube present. EXAM: PORTABLE CHEST 1 VIEW COMPARISON:  02/02/2017 FINDINGS: Cardiac pads remain on the chest. Endotracheal tube is roughly 6.4 cm above the carina. Nasogastric tube  extends into the abdomen. Left jugular central line in the SVC. Hazy densities in the lower lungs. Negative for pneumothorax. No pulmonary edema. Heart size remains mildly enlarged. IMPRESSION: Hazy densities in lower chest are nonspecific but may represent atelectasis. No overt pulmonary edema. Support apparatuses as described. Electronically Signed   By: Richarda Overlie M.D.   On: 02/03/2017 07:49    STUDIES:  10/10 left heart cath >ef 35%, NL coronary art.  CULTURES:   ANTIBIOTICS:   SIGNIFICANT EVENTS: 10/9 > 20 min VT/VF arrest. Cath lab, Cooling started 10/10 0030, temp goal reached 0230 10/10 10/11 warming. Needs ct head  LINES/TUBES: ETT 10/10 > 11/10 l ij cvl>>  DISCUSSION: 46 year old female who suffered a witnessed cardiac arrest 10/9. VT/VF resolved with shocks. Estimated 20 minutes in duration. Intubated in emergency department taken emergently to the cardiac catheterization lab. Therapeutic hyp[facility]mia initiated at [facility].  ASSESSMENT / PLAN:  PULMONARY A: Inability to protect airway status post cardiac arrest Tobacco abuse disorder  P:    Full vent support ABG Ventilator associated pneumonia protocol Chest x-ray  No weaning trials until rewarmed and off NMB Decreased  rr  With ph 7.51, check abg  One hour  CARDIOVASCULAR A:  Cardiac arrest: VT/VF 20 minute downtime  H/o HTN  P:  Hemodynamic monitoring in the ICU setting cardiology admitting Emergently to cath lab, ef 35%, NL coronary arteries  Targeted temperature management 33C, warming 10/11 Amio for ventricular ectopics, consider dc drip with sb 53  RENAL Lab Results  Component Value Date   CREATININE 0.81 02/03/2017   CREATININE 0.64 02/03/2017   CREATININE 0.63 02/02/2017    Recent Labs Lab 02/02/17 2353 02/03/17 0337 02/03/17 0735  K 3.7 4.0 3.6     Recent Labs Lab 02/02/17 2353 02/03/17 0337 02/03/17 0735  NA 134* 133* 135    A:   Hypokalemia Acute kidney injury Lactic  acidosis lactic acid has normalized to 1.8 on 10/11  P:   BMP dailr Replete lytes as needed  Strict I&O   GASTROINTESTINAL A:   No acute issues P:   Nothing by mouth Protonix TF when off NMB  HEMATOLOGIC  Recent Labs  02/02/17 1155 02/03/17 0337  HGB 11.9* 9.5*    A:   Anemia P:  Follow CBC Anticoagulation/antiplatelet per cardiology  INFECTIOUS A:   No acute issues P:   Monitor temp curve and wbc  ENDOCRINE CBG (last 3)   Recent Labs  02/02/17 1954 02/02/17 2339 02/03/17 0346  GLUCAP 108* 126* 104*     A:   Hyperglycemia with no history  of diabetes mellitus P:   CBG monitoring and sliding scale insulin Will most likely need basal insulin when on tf  NEUROLOGIC A:   Acute anoxic encephalopathy, 10/11 in NMB for hypothermia protocol P:   RASS goal:  -4 to -5 Intravenous fentanyl and Versed CT head once stable EEG to rule out nonconvulsive status. Abn by review 10/11. Needs head ct  FAMILY  - Updates: Large family including son and daughter updated in consultation room in the emergency department. 10/11 no family at bedside  - Inter-disciplinary family meet or Palliative Care meeting due by:  October 18  -App CCT 45 min   Brett Canales Tyrah Broers ACNP [facility] PCCM Pager [phone] till 3 pm If no answer page [phone] 02/03/2017, 8:41 AM

## 2017-02-04 ENCOUNTER — Inpatient Hospital Stay (HOSPITAL_COMMUNITY): Payer: Medicaid Other

## 2017-02-04 LAB — GLUCOSE, CAPILLARY
GLUCOSE-CAPILLARY: 101 mg/dL — AB (ref 65–99)
GLUCOSE-CAPILLARY: 119 mg/dL — AB (ref 65–99)
GLUCOSE-CAPILLARY: 127 mg/dL — AB (ref 65–99)
Glucose-Capillary: 93 mg/dL (ref 65–99)
Glucose-Capillary: 84 mg/dL (ref 65–99)
Glucose-Capillary: 93 mg/dL (ref 65–99)
Glucose-Capillary: 95 mg/dL (ref 65–99)

## 2017-02-04 LAB — BASIC METABOLIC PANEL
Anion gap: 7 (ref 5–15)
BUN: 10 mg/dL (ref 6–20)
CALCIUM: 7.3 mg/dL — AB (ref 8.9–10.3)
CO2: 26 mmol/L (ref 22–32)
CREATININE: 0.87 mg/dL (ref 0.44–1.00)
Chloride: 99 mmol/L — ABNORMAL LOW (ref 101–111)
Glucose, Bld: 103 mg/dL — ABNORMAL HIGH (ref 65–99)
Potassium: 3.5 mmol/L (ref 3.5–5.1)
SODIUM: 132 mmol/L — AB (ref 135–145)

## 2017-02-04 LAB — BLOOD GAS, ARTERIAL
Acid-Base Excess: 4 mmol/L — ABNORMAL HIGH (ref 0.0–2.0)
Bicarbonate: 28.5 mmol/L — ABNORMAL HIGH (ref 20.0–28.0)
Drawn by: 46420
FIO2: 40
O2 Saturation: 95.8 %
PATIENT TEMPERATURE: 98.6
PCO2 ART: 46.9 mmHg (ref 32.0–48.0)
PEEP: 5 cmH2O
RATE: 20 resp/min
VT: 420 mL
pH, Arterial: 7.401 (ref 7.350–7.450)
pO2, Arterial: 78.3 mmHg — ABNORMAL LOW (ref 83.0–108.0)

## 2017-02-04 LAB — CBC
HCT: 25.8 % — ABNORMAL LOW (ref 36.0–46.0)
Hemoglobin: 8.7 g/dL — ABNORMAL LOW (ref 12.0–15.0)
MCH: 25.7 pg — ABNORMAL LOW (ref 26.0–34.0)
MCHC: 33.7 g/dL (ref 30.0–36.0)
MCV: 76.3 fL — ABNORMAL LOW (ref 78.0–100.0)
PLATELETS: 110 10*3/uL — AB (ref 150–400)
RBC: 3.38 MIL/uL — ABNORMAL LOW (ref 3.87–5.11)
RDW: 16 % — AB (ref 11.5–15.5)
WBC: 8.6 10*3/uL (ref 4.0–10.5)

## 2017-02-04 LAB — PHOSPHORUS: PHOSPHORUS: 2.6 mg/dL (ref 2.5–4.6)

## 2017-02-04 LAB — PROCALCITONIN: PROCALCITONIN: 0.17 ng/mL

## 2017-02-04 LAB — MAGNESIUM: MAGNESIUM: 2.2 mg/dL (ref 1.7–2.4)

## 2017-02-04 MED ORDER — DEXMEDETOMIDINE HCL IN NACL 400 MCG/100ML IV SOLN
0.4000 ug/kg/h | INTRAVENOUS | Status: DC
  Administered 2017-02-04: 0.5 ug/kg/h via INTRAVENOUS
  Administered 2017-02-04: 0.6 ug/kg/h via INTRAVENOUS
  Filled 2017-02-04: qty 200
  Filled 2017-02-04: qty 100

## 2017-02-04 MED ORDER — THIAMINE HCL 100 MG/ML IJ SOLN
100.0000 mg | Freq: Every day | INTRAMUSCULAR | Status: DC
  Administered 2017-02-04: 100 mg via INTRAVENOUS
  Filled 2017-02-04: qty 2

## 2017-02-04 MED ORDER — POTASSIUM CHLORIDE 20 MEQ/15ML (10%) PO SOLN
20.0000 meq | Freq: Once | ORAL | Status: AC
  Administered 2017-02-04: 20 meq
  Filled 2017-02-04: qty 15

## 2017-02-04 MED ORDER — VITAL HIGH PROTEIN PO LIQD: 1000.0000 mL | ORAL | Status: DC

## 2017-02-04 MED ORDER — FOLIC ACID 5 MG/ML IJ SOLN
1.0000 mg | Freq: Every day | INTRAMUSCULAR | Status: DC
  Administered 2017-02-04: 1 mg via INTRAVENOUS
  Filled 2017-02-04 (×2): qty 0.2

## 2017-02-04 MED ORDER — FUROSEMIDE 10 MG/ML IJ SOLN
20.0000 mg | Freq: Once | INTRAMUSCULAR | Status: AC
  Administered 2017-02-04: 20 mg via INTRAVENOUS
  Filled 2017-02-04: qty 2

## 2017-02-04 MED ORDER — POTASSIUM CHLORIDE 20 MEQ/15ML (10%) PO SOLN
40.0000 meq | Freq: Once | ORAL | Status: AC
  Administered 2017-02-04: 40 meq
  Filled 2017-02-04: qty 30

## 2017-02-04 MED ORDER — VITAL AF 1.2 CAL PO LIQD
1000.0000 mL | ORAL | Status: DC
  Administered 2017-02-04: 1000 mL

## 2017-02-04 MED ORDER — SODIUM CHLORIDE 0.9 % IV SOLN
3.0000 g | Freq: Four times a day (QID) | INTRAVENOUS | Status: DC
  Administered 2017-02-04 – 2017-02-08 (×17): 3 g via INTRAVENOUS
  Filled 2017-02-04 (×19): qty 3

## 2017-02-04 NOTE — Progress Notes (Signed)
   ET tube 5.3 cm above carina - > order to advance by 2.5cm given  Tube feeds in mouth -> RN instucted to dc tube feeds  Sedation - callmer after incrased precedex  Dr. Kalman Shan, M.D., [facility].C.P Pulmonary and Critical Care Medicine Staff Physician [facility]  Pulmonary and Critical Care Pager: [phone], If no answer or between  15:00h - 7:00h: call [phone]  02/04/2017 9:20 PM

## 2017-02-04 NOTE — Progress Notes (Signed)
Pt desat to 87%.  Initially increased fio2 to 50%- sat 90%.  Increased fio2 to 60%, sat now 91-92%.  RN aware.

## 2017-02-04 NOTE — Progress Notes (Signed)
    Rewarmed and fully following commands Uncomforatble , sweaty On normothermia  Nearly self extubated x 2  Plan Dc pads for comfort - maintain fever control Keep sedated overnight cxr stat   Dr. Kalman Shan, M.D., [facility].C.P Pulmonary and Critical Care Medicine Staff Physician [facility] [place] Pulmonary and Critical Care Pager: [phone], If no answer or between  15:00h - 7:00h: call [phone]  02/04/2017 8:23 PM  '

## 2017-02-04 NOTE — Progress Notes (Signed)
Initial Nutrition Assessment  DOCUMENTATION CODES:   Not applicable  INTERVENTION:   -Initiate TF via OGT with Vital AF 1.2 at goal rate of 50 ml/h (1200 ml per day) to provide 1440 kcals, 90 gm protein, 973 ml free water daily.  NUTRITION DIAGNOSIS:   Inadequate oral intake related to inability to eat as evidenced by NPO status.  Ongoing  GOAL:   Patient will meet greater than or equal to 90% of their needs  Progressomg  MONITOR:   Weight trends, I & O's, Vent status, Labs, Diet advancement  REASON FOR ASSESSMENT:   Consult Enteral/tube feeding initiation and management  ASSESSMENT:   Pt with no pertinent PMH presents s/p cardiac arrest. Pt was intubated to protect her airway and hypothermia protocol was initiated.   Patient remains intubated on ventilator support. OGT in place. MV: 8.4 L/min Temp (24hrs), Avg:98.4 F (36.9 C), Min:97.3 F (36.3 C), Max:99.9 F (37.7 C)  Case discussed with RN, who confirms plan to start TF today. Pt has been rewarmed.   Nutrition-Focused physical exam completed. Findings are no fat depletion, no muscle depletion, and no edema.   Medications reviewed and include potassium, fentanyl, amiodarone, precedex, and thiamine.   Labs reviewed: Na: 132. Orders for glycemic control 2-6 units insulin aspart every 4 hours.   Diet Order:  Diet NPO time specified  Skin:  Reviewed, no issues  Last BM:  PTA  Height:   Ht Readings from Last 1 Encounters:  02/02/17  (1.6 m)    Weight:   Wt Readings from Last 1 Encounters:  02/04/17 124 lb 12.5 oz (56.6 kg)    Ideal Body Weight:  52.3 kg  BMI:  Body mass index is 22.1 kg/m.  Estimated Nutritional Needs:   Kcal:  1470.6  Protein:  80-95 grams  Fluid:  > 1.4 L  EDUCATION NEEDS:   No education needs identified at this time  Olimpia Tinch A. Mayford Knife, RD, LDN, CDE Pager: [phone] After hours Pager: [phone]

## 2017-02-04 NOTE — Progress Notes (Signed)
   02/04/17 1030  Clinical Encounter Type  Visited With Family  Visit Type Initial  Spiritual Encounters  Spiritual Needs Prayer;Emotional  Stress Factors  Family Stress Factors Health changes   Encountered family in 2H waiting area.  They had an opportunity to share about their loved one the patient and express their worry.  Following a visit I did go pray for the patient even thought the patient was asleep.  Will follow as needed Chaplain Agustin Cree

## 2017-02-04 NOTE — Progress Notes (Signed)
   Self extubated Looks good No distrss  Plan abg -> if ok dc aline monitopr  Dr. Kalman Shan, M.D., [facility].C.P Pulmonary and Critical Care Medicine Staff Physician [facility]  Pulmonary and Critical Care Pager: [phone], If no answer or between  15:00h - 7:00h: call [phone]  02/04/2017 10:27 PM

## 2017-02-04 NOTE — Progress Notes (Signed)
Versed 20 mL wasted in sink with myself, Hessie Dibble, RN and [place] Price, RN.  Frutoso Chase, RN 02/04/17

## 2017-02-04 NOTE — Progress Notes (Signed)
PULMONARY / CRITICAL CARE MEDICINE   Name: Raven Martinez MRN: [id number] DOB: [birth year]    ADMISSION DATE:  02/01/2017 CONSULTATION DATE:  02/01/2017  REFERRING MD:  Dr. Jearld Pies  CHIEF COMPLAINT:  Cardiac arrest  HISTORY OF PRESENT ILLNESS:  46 year old female with past medical history significant for hypertension and chronic smoker. She was witnessed to become unresponsive by her boyfriend while at home and previously in her usual state of health. EMS was called and upon arrival noted her to be in ventricular fibrillation. No bystander CPR was administered and EMS promptly began ACLS protocol. He received 5 total defibrillation for return of spontaneous circulation in the emergency department. Total downtime estimated at 20 minutes. Immediately after ROSC she was noted to be moving all 4 extremities, however, she was not moving purposefully. She was intubated for airway protection. She remained in normal sinus rhythm and was hemodynamics stable. She is for cath lab and ICU admission. PCCM asked to evaluate.    SUBJECTIVE:  Off hypothermia protocol. Agiatated. Increased fio2 needs  VITAL SIGNS: BP (!) 109/98   Pulse 93   Temp 98.8 F (37.1 C) (Core (Comment))   Resp (!) 27   Ht  (1.6 m)   Wt 124 lb 12.5 oz (56.6 kg)   SpO2 91% Comment: d/t desat to 87%  BMI 22.10 kg/m   HEMODYNAMICS: CVP:  [5 mmHg-10 mmHg] 10 mmHg  VENTILATOR SETTINGS: Vent Mode: PRVC FiO2 (%):  [30 %-60 %] 60 % Set Rate:  [20 bmp] 20 bmp Vt Set:  [420 mL] 420 mL PEEP:  [5 cmH20] 5 cmH20 Plateau Pressure:  [16 cmH20-20 cmH20] 20 cmH20  INTAKE / OUTPUT: I/O last 3 completed shifts: In: 4283.6 [I.V.:3783.6; NG/GT:200; IV Piggyback:300] Out: 600 [Urine:550; Emesis/NG output:50]  PHYSICAL EXAMINATION: General:  Thin ill appearing female. Agitated or sedated, no follows commands, fio2 up to 60% HEENT:ET ->vent, OGT-> LWIS WUJ:WJXBJYNWG mae x 4, no follows commands Neuro: mae x 4 CV: s1s2 rrr, no  m/r/g NSR 87 PULM:lungs bilaterally coarse rhonchi bilaterally , ABD:  non-tender, bsx4 active  Extremities: warm/dry, -edema  Skin: no rashes or lesions    LABS:  BMET  Recent Labs Lab 02/03/17 1200 02/03/17 1710 02/04/17 0443  NA 133* 133* 132*  K 3.3* 4.2 3.5  CL 101 100* 99*  CO2 BUN CREATININE 0.90 1.15* 0.87  GLUCOSE 112* 127* 103*    Electrolytes  Recent Labs Lab 02/02/17 0120 02/02/17 1020  02/03/17 1200 02/03/17 1710 02/04/17 0443  CALCIUM 7.5*  --   < > 6.5* 7.1* 7.3*  MG 1.5* 1.3*  --   --   --  2.2  PHOS 3.8 1.8*  --   --   --  2.6  < > = values in this interval not displayed.  CBC  Recent Labs Lab 02/02/17 0157  02/02/17 1155 02/03/17 0337 02/04/17 0443  WBC 7.6  --   --  7.8 8.6  HGB 10.8*  < > 11.9* 9.5* 8.7*  HCT 32.9*  < > 35.0* 26.8* 25.8*  PLT 221  --   --  109* 110*  < > = values in this interval not displayed.  Coag's  Recent Labs Lab 02/01/17 2315 02/02/17 0120 02/02/17 1020  APTT  --  30 35  INR 1.29 1.18 1.15    Sepsis Markers  Recent Labs Lab 02/02/17 0246 02/02/17 0900 02/02/17 1150  LATICACIDVEN 3.5* 1.6 1.8    ABG  Recent Labs Lab 02/03/17 0545 02/03/17 1006 02/04/17 0450  PHART 7.514* 7.400 7.401  PCO2ART 27.3* 36.3 46.9  PO2ART 145* 91.0 78.3*    Liver Enzymes  Recent Labs Lab 02/01/17 2315  AST 38  ALT 15  ALKPHOS 47  BILITOT 0.8  ALBUMIN 3.2*    Cardiac Enzymes  Recent Labs Lab 02/02/17 1020 02/02/17 1502 02/02/17 1949  TROPONINI 1.33* 0.92* 0.56*    Glucose  Recent Labs Lab 02/03/17 1233 02/03/17 1645 02/03/17 1956 02/03/17 2336 02/04/17 0341 02/04/17 0809  GLUCAP 115* 127* 125* 93 93 84    Imaging Ct Head Wo Contrast  Result Date: 02/03/2017 CLINICAL DATA:  Cardiac arrest EXAM: CT HEAD WITHOUT CONTRAST TECHNIQUE: Contiguous axial images were obtained from the base of the skull through the vertex without intravenous contrast. COMPARISON:   None. FINDINGS: Brain: No mass lesion, intraparenchymal hemorrhage or extra-axial collection. No evidence of acute cortical infarct. Brain parenchyma and CSF-containing spaces are normal for age. Vascular: No hyperdense vessel or unexpected calcification. Skull: Normal visualized skull base, calvarium and extracranial soft tissues. Sinuses/Orbits: No sinus fluid levels or advanced mucosal thickening. No mastoid effusion. Normal orbits. IMPRESSION: Normal head CT. Electronically Signed   By: Deatra Robinson M.D.   On: 02/03/2017 21:00   Dg Chest Port 1 View  Result Date: 02/04/2017 CLINICAL DATA:  Respiratory failure.  Intubated. EXAM: PORTABLE CHEST 1 VIEW COMPARISON:  Yesterday. FINDINGS: Stable enlarged cardiac silhouette. Interval increased opacity at both lung bases. Endotracheal tube in satisfactory position. Left jugular catheter tip in the superior vena cava. Nasogastric tube extending into the stomach. Unremarkable bones. IMPRESSION: 1. Increased bibasilar atelectasis and possible pneumonia. 2. Stable cardiomegaly. Electronically Signed   By: Beckie Salts M.D.   On: 02/04/2017 07:23    STUDIES:  10/10 left heart cath >ef 35%, NL coronary art.  CULTURES: 10/12 sputum>> 10/12 bc>> 10/12 uc>> 10/12 procal>>  ANTIBIOTICS: 10/12 unasyn>>  SIGNIFICANT EVENTS: 10/9 > 20 min VT/VF arrest. Cath lab, Cooling started 10/10 0030, temp goal reached 0230 10/10 10/11 warming. Needs ct head 10/12 increased fio2 needs 10/12 treat for asp pna  LINES/TUBES: ETT 10/10 > 11/10 l ij cvl>>  DISCUSSION: 46 year old female who suffered a witnessed cardiac arrest 10/9. VT/VF resolved with shocks. Estimated 20 minutes in duration. Intubated in emergency department taken emergently to the cardiac catheterization lab. Therapeutic hypothermia initiated at [facility].  ASSESSMENT / PLAN:  PULMONARY A: Inability to protect airway status post cardiac arrest Tobacco abuse disorder 10/12 increase fio2 needs.  Chest x ray ? aspiration    P:    Full vent support ABG as needed Ventilator associated pneumonia protocol Chest x-ray as noted  10/12 does not meet weaning parameters 10/12 start abx for ? Asp pna 10/12 1 dose of lasix after K+ repleted  CARDIOVASCULAR A:  Cardiac arrest: VT/VF 20 minute downtime  H/o HTN  P:  Hemodynamic monitoring in the ICU setting cardiology admitting Emergently to cath lab, ef 35%, NL coronary arteries  Targeted temperature management 33C, warming 10/11 and hypothermia completed 10/12 Amio for ventricular ectopics.  RENAL Lab Results  Component Value Date   CREATININE 0.87 02/04/2017   CREATININE 1.15 (H) 02/03/2017   CREATININE 0.90 02/03/2017    Recent Labs Lab 02/03/17 1200 02/03/17 1710 02/04/17 0443  K 3.3* 4.2 3.5     Recent Labs Lab 02/03/17 1200 02/03/17 1710 02/04/17 0443  NA 133* 133* 132*    A:   Hypokalemia Acute kidney injury Lactic acidosis lactic acid  has normalized to 1.8 on 10/11  P:   BMP daily Replete lytes as needed  Strict I&O   GASTROINTESTINAL A:   No acute issues P:   Nothing by mouth Protonix TF ordered 10/12 per nurt consult  HEMATOLOGIC  Recent Labs  02/03/17 0337 02/04/17 0443  HGB 9.5* 8.7*    A:   Anemia P:  Follow CBC Anticoagulation/antiplatelet per cardiology  INFECTIOUS A:   Increased fio2 needs CxR cw aspiration P:   10/12 unasyn 10/12 pan culture 10/12 check procal  ENDOCRINE CBG (last 3)   Recent Labs  02/03/17 2336 02/04/17 0341 02/04/17 0809  GLUCAP 93 93 84     A:   Hyperglycemia with no history of diabetes mellitus P:   CBG monitoring and sliding scale insulin Will most likely need basal insulin when on tf  NEUROLOGIC A:   Acute anoxic encephalopathy, 10/11 in [facility] for hypothermia protocol 10/11 ct head neg 10/12 waking up agitated P:   RASS goal:  -1 Intravenous fentanyl , dc versed and use precedex ? Substance abuse will add thiamine /  folic acid for completeness 11/12  FAMILY  - Updates: Large family including son and daughter updated in consultation room in the emergency department. 10/11 no family at bedside  - Inter-disciplinary family meet or Palliative Care meeting due by:  October 18  -App CCT 45 min   Brett Canales Minor ACNP [facility] PCCM Pager [phone] till 3 pm If no answer page [phone] 02/04/2017, 9:21 AM

## 2017-02-05 ENCOUNTER — Encounter (HOSPITAL_COMMUNITY): Payer: Self-pay

## 2017-02-05 ENCOUNTER — Inpatient Hospital Stay (HOSPITAL_COMMUNITY): Payer: Medicaid Other

## 2017-02-05 ENCOUNTER — Other Ambulatory Visit (HOSPITAL_COMMUNITY): Payer: Self-pay

## 2017-02-05 DIAGNOSIS — J690 Pneumonitis due to inhalation of food and vomit: Secondary | ICD-10-CM

## 2017-02-05 DIAGNOSIS — I340 Nonrheumatic mitral (valve) insufficiency: Secondary | ICD-10-CM

## 2017-02-05 DIAGNOSIS — I5021 Acute systolic (congestive) heart failure: Secondary | ICD-10-CM

## 2017-02-05 DIAGNOSIS — R9431 Abnormal electrocardiogram [ECG] [EKG]: Secondary | ICD-10-CM | POA: Diagnosis present

## 2017-02-05 DIAGNOSIS — I351 Nonrheumatic aortic (valve) insufficiency: Secondary | ICD-10-CM

## 2017-02-05 LAB — BASIC METABOLIC PANEL
Anion gap: 7 (ref 5–15)
BUN: 10 mg/dL (ref 6–20)
CHLORIDE: 99 mmol/L — AB (ref 101–111)
CO2: 28 mmol/L (ref 22–32)
CREATININE: 0.88 mg/dL (ref 0.44–1.00)
Calcium: 7.6 mg/dL — ABNORMAL LOW (ref 8.9–10.3)
GFR calc non Af Amer: >60 mL/min (ref >60)
Glucose, Bld: 101 mg/dL — ABNORMAL HIGH (ref 65–99)
Potassium: 3.4 mmol/L — ABNORMAL LOW (ref 3.5–5.1)
Sodium: 134 mmol/L — ABNORMAL LOW (ref 135–145)

## 2017-02-05 LAB — GLUCOSE, CAPILLARY
GLUCOSE-CAPILLARY: 104 mg/dL — AB (ref 65–99)
GLUCOSE-CAPILLARY: 95 mg/dL (ref 65–99)
Glucose-Capillary: 89 mg/dL (ref 65–99)
Glucose-Capillary: 83 mg/dL (ref 65–99)
Glucose-Capillary: 99 mg/dL (ref 65–99)

## 2017-02-05 LAB — CBC
HCT: 23.5 % — ABNORMAL LOW (ref 36.0–46.0)
Hemoglobin: 7.8 g/dL — ABNORMAL LOW (ref 12.0–15.0)
MCH: 25.6 pg — AB (ref 26.0–34.0)
MCHC: 33.2 g/dL (ref 30.0–36.0)
MCV: 77 fL — AB (ref 78.0–100.0)
PLATELETS: 99 10*3/uL — AB (ref 150–400)
RBC: 3.05 MIL/uL — AB (ref 3.87–5.11)
RDW: 15.8 % — AB (ref 11.5–15.5)
WBC: 8.7 10*3/uL (ref 4.0–10.5)

## 2017-02-05 LAB — POCT I-STAT 3, ART BLOOD GAS (G3+)
ACID-BASE EXCESS: 5 mmol/L — AB (ref 0.0–2.0)
BICARBONATE: 30.1 mmol/L — AB (ref 20.0–28.0)
O2 SAT: 98 %
PCO2 ART: 47.6 mmHg (ref 32.0–48.0)
PO2 ART: 110 mmHg — AB (ref 83.0–108.0)
Patient temperature: 98.3
TCO2: 31 mmol/L (ref 22–32)
pH, Arterial: 7.408 (ref 7.350–7.450)

## 2017-02-05 LAB — PHOSPHORUS: Phosphorus: 3.1 mg/dL (ref 2.5–4.6)

## 2017-02-05 LAB — PROCALCITONIN: Procalcitonin: 0.19 ng/mL

## 2017-02-05 LAB — MAGNESIUM: MAGNESIUM: 1.9 mg/dL (ref 1.7–2.4)

## 2017-02-05 MED ORDER — FUROSEMIDE 10 MG/ML IJ SOLN
40.0000 mg | Freq: Two times a day (BID) | INTRAMUSCULAR | Status: DC
  Administered 2017-02-05 – 2017-02-07 (×6): 40 mg via INTRAVENOUS
  Filled 2017-02-05 (×6): qty 4

## 2017-02-05 MED ORDER — FOLIC ACID 1 MG PO TABS
1.0000 mg | ORAL_TABLET | Freq: Every day | ORAL | Status: DC
  Administered 2017-02-05 – 2017-02-10 (×6): 1 mg via ORAL
  Filled 2017-02-05 (×5): qty 1

## 2017-02-05 MED ORDER — SPIRONOLACTONE 25 MG PO TABS
25.0000 mg | ORAL_TABLET | Freq: Every day | ORAL | Status: DC
  Administered 2017-02-05 – 2017-02-07 (×3): 25 mg via ORAL
  Filled 2017-02-05 (×3): qty 1

## 2017-02-05 MED ORDER — VITAMIN B-1 100 MG PO TABS
100.0000 mg | ORAL_TABLET | Freq: Every day | ORAL | Status: DC
  Administered 2017-02-05 – 2017-02-10 (×6): 100 mg via ORAL
  Filled 2017-02-05 (×6): qty 1

## 2017-02-05 MED ORDER — ORAL CARE MOUTH RINSE: 15.0000 mL | Freq: Two times a day (BID) | OROMUCOSAL | Status: DC

## 2017-02-05 MED ORDER — FUROSEMIDE 10 MG/ML IJ SOLN
40.0000 mg | Freq: Once | INTRAMUSCULAR | Status: AC
  Filled 2017-02-05: qty 4

## 2017-02-05 MED ORDER — POTASSIUM CHLORIDE CRYS ER 20 MEQ PO TBCR
40.0000 meq | EXTENDED_RELEASE_TABLET | Freq: Once | ORAL | Status: AC
  Administered 2017-02-05: 40 meq via ORAL
  Filled 2017-02-05: qty 2

## 2017-02-05 MED ORDER — CARVEDILOL 3.125 MG PO TABS
3.1250 mg | ORAL_TABLET | Freq: Two times a day (BID) | ORAL | Status: DC
  Administered 2017-02-05 – 2017-02-10 (×11): 3.125 mg via ORAL
  Filled 2017-02-05 (×11): qty 1

## 2017-02-05 MED ORDER — ASPIRIN EC 81 MG PO TBEC
81.0000 mg | DELAYED_RELEASE_TABLET | Freq: Every day | ORAL | Status: DC
  Administered 2017-02-05 – 2017-02-09 (×5): 81 mg via ORAL
  Filled 2017-02-05 (×5): qty 1

## 2017-02-05 NOTE — Progress Notes (Signed)
*  PRELIMINARY RESULTS* Echocardiogram 2D Echocardiogram has been performed.  Raven Martinez 02/05/2017, 2:31 PM

## 2017-02-05 NOTE — Progress Notes (Signed)
PULMONARY / CRITICAL CARE MEDICINE   Name: Raven Martinez MRN: [id number] DOB: May 07, 1970    ADMISSION DATE:  02/01/2017 CONSULTATION DATE:  02/01/2017  REFERRING MD:  Dr. Jearld Pies  CHIEF COMPLAINT:  Cardiac arrest  HISTORY OF PRESENT ILLNESS:  46 year old female with past medical history significant for hypertension and chronic smoker. She was witnessed to become unresponsive by her boyfriend while at home and previously in her usual state of health. EMS was called and upon arrival noted her to be in ventricular fibrillation. No bystander CPR was administered and EMS promptly began ACLS protocol. He received 5 total defibrillation for return of spontaneous circulation in the emergency department. Total downtime estimated at 20 minutes. Immediately after ROSC she was noted to be moving all 4 extremities, however, she was not moving purposefully. She was intubated for airway protection. She remained in normal sinus rhythm and was hemodynamics stable. She is for cath lab and ICU admission. PCCM asked to evaluate.    SUBJECTIVE:  Self extubated, weaning oxygen, no distress.  VITAL SIGNS: BP (!) 141/91   Pulse 75   Temp 98.5 F (36.9 C) (Oral)   Resp 20   Ht  (1.6 m)   Wt 124 lb 12.5 oz (56.6 kg)   SpO2 99%   BMI 22.10 kg/m  4 L via nasal cannula   INTAKE / OUTPUT:  Intake/Output Summary (Last 24 hours) at 02/05/17 6578 Last data filed at 02/05/17 0800  Gross per 24 hour  Intake          1290.15 ml  Output             1070 ml  Net           220.15 ml     PHYSICAL EXAMINATION: General:  Thin African-American female resting in the bed, no acute distress  HEENT normocephalic atraumatic no JVD mucous membranes are moist  PSY/ Neuro: Awake, oriented to place self, not able to tell me the year. She moves all extremities, she is appropriate, has no focal motor deficits CV: Regular rate and rhythm without murmur rub or gallop PULM: Bibasilar rales no accessory muscle use ABD:  Soft nontender positive bowel sounds no organomegaly Extremities: Warm dry no edema brisk cap refill  Skin: Warm and dry  LABS:  BMET  Recent Labs Lab 02/03/17 1710 02/04/17 0443 02/05/17 0405  NA 133* 132* 134*  K 4.2 3.5 3.4*  CL 100* 99* 99*  CO2 BUN CREATININE 1.15* 0.87 0.88  GLUCOSE 127* 103* 101*    Electrolytes  Recent Labs Lab 02/02/17 1020  02/03/17 1710 02/04/17 0443 02/05/17 0405  CALCIUM  --   < > 7.1* 7.3* 7.6*  MG 1.3*  --   --  2.2 1.9  PHOS 1.8*  --   --  2.6 3.1  < > = values in this interval not displayed.  CBC  Recent Labs Lab 02/03/17 0337 02/04/17 0443 02/05/17 0405  WBC 7.8 8.6 8.7  HGB 9.5* 8.7* 7.8*  HCT 26.8* 25.8* 23.5*  PLT 109* 110* 99*    Coag's  Recent Labs Lab 02/01/17 2315 02/02/17 0120 02/02/17 1020  APTT  --  30 35  INR 1.29 1.18 1.15    Sepsis Markers  Recent Labs Lab 02/02/17 0246 02/02/17 0900 02/02/17 1150 02/04/17 1227 02/05/17 0405  LATICACIDVEN 3.5* 1.6 1.8  --   --   PROCALCITON  --   --   --  0.17 0.19    ABG  Recent Labs Lab 02/03/17 1006 02/04/17 0450 02/05/17 0022  PHART 7.400 7.401 7.408  PCO2ART 36.3 46.9 47.6  PO2ART 91.0 78.3* 110.0*    Liver Enzymes  Recent Labs Lab 02/01/17 2315  AST 38  ALT 15  ALKPHOS 47  BILITOT 0.8  ALBUMIN 3.2*    Cardiac Enzymes  Recent Labs Lab 02/02/17 1020 02/02/17 1502 02/02/17 1949  TROPONINI 1.33* 0.92* 0.56*    Glucose  Recent Labs Lab 02/04/17 0809 02/04/17 1203 02/04/17 1603 02/04/17 1939 02/04/17 2352 02/05/17 0412  GLUCAP 84 93 95 119* 101* 95    Imaging Dg Chest Port 1 View  Result Date: 02/05/2017 CLINICAL DATA:  Respiratory failure. EXAM: PORTABLE CHEST 1 VIEW COMPARISON:  Radiograph of February 04, 2017. FINDINGS: Stable cardiomegaly. Endotracheal tube has been removed. Left internal jugular catheter is noted with distal tip in expected position of the SVC. No pneumothorax is noted.  Increased bilateral perihilar and basilar interstitial densities are noted most consistent with edema or less likely inflammation. Mild bilateral pleural effusions are noted, left greater than right. Bony thorax is unremarkable. IMPRESSION: Endotracheal tube has been removed. Probably increased bilateral pulmonary edema or inflammation is noted in both lungs. Bilateral pleural effusions are noted as well. Electronically Signed   By: Lupita Raider, M.D.   On: 02/05/2017 07:25   Dg Chest Port 1 View  Result Date: 02/04/2017 CLINICAL DATA:  Endotracheal tube EXAM: PORTABLE CHEST 1 VIEW COMPARISON:  Portable exam 2031 hours compared to 02/04/2017 FINDINGS: Tip of endotracheal tube projects approximately 5.3 cm above carina. Esophageal probe present tip projecting over distal esophagus. LEFT jugular central venous catheter with tip projecting over SVC. External pacing leads in EKG leads present. Enlargement of cardiac silhouette. Mediastinal contours and pulmonary vascularity normal. Bibasilar and RIGHT upper lobe infiltrates. No pleural effusion or pneumothorax. Gas distention of stomach. IMPRESSION: BILATERAL pulmonary infiltrates at the bases and in the RIGHT upper lobe adjacent to minor fissure. Gas distention of stomach. Electronically Signed   By: Ulyses Southward M.D.   On: 02/04/2017 20:49    STUDIES:  10/10 left heart cath >ef 35%, NL coronary art.  CULTURES: 10/12 sputum: Abundant white blood cells, rare squamous epithelial cells, abundant gram-positive cocci in pairs and clusters. Rare gram-positive rods 10/12 bc>> 10/12 uc>>   ANTIBIOTICS: 10/12 unasyn>>  SIGNIFICANT EVENTS: 10/9 > 20 min VT/VF arrest. Cath lab, Cooling started 10/10 0030, temp goal reached 0230 10/10 10/11 warming. Needs ct head 10/12 increased fio2 needs 10/12 treat for asp pna 10/12 self extubated  LINES/TUBES: ETT 10/10 > 10/12 11/10 l ij cvl>>  DISCUSSION: 46 year old female who suffered a witnessed cardiac  arrest 10/9. VT/VF resolved with shocks. Estimated 20 minutes in duration. Intubated in emergency department taken emergently to the cardiac catheterization lab. Therapeutic hypothermia initiated at [facility]. Self extubated on 10/12 looks remarkably well. She is awake alert oriented 2, no distress. Chest x-ray looks like element of edema today superimposed on aspiration pneumonia however no distress from pulmonary standpoint. For today plan will be as follows: Lasix 1, continue antibiotics, start diet, mobilize, and await further recommendations for cardiology team in regards to newly diagnosed nonischemic cardiomyopathy and further recommendations in regards to antiarrhythmics? Will she need ICD, probably also needs to be set up with heart failure team post discharge. She is stable for transfer out of the intensive care will move her to the cardiac stepdown unit. Critical care will sign off, we'll discuss with  cardiology, she can either go to primary cardiology service or ask internal medicine to assume care.  ASSESSMENT / PLAN:   Cardiac arrest: VT/VF 20 minute downtime  H/o HTN Acute systolic heart failure, EF in cath lab 35% with normal coronary arteries Plan: Continue amiodarone for now Add back aspirin Not seen by cardiology since 10/10, we'll ask them to be reinvolved specifically for: Further recommendations for heart failure, will he need ICD? What to do with antiarrhythmics Timing of follow-up echocardiogram, as well as routine cardiology follow-up following primary VT VF arrest Lasix today  Acute anoxic encephalopathy, Completed hypothermia protocol on 10/12. 10/11 ct head neg Plan. Continue thiamine and folate acid Discontinue sedating medications Consult physical therapy Mobilize  resolved acute respiratory failure, self extubated 10/12 Aspiration pneumonia -Portable chest x-ray personally reviewed: Left IJ CVL in satisfactory position, endotracheal tube has been removed. There  is increased right greater than left bilateral airspace disease suspect consistent with mix of both edema as well as previously mentioned aspiration pneumonia -Sputum culture obtained 10/2 abundant GPC in pairs and clusters rare G PR -Afebrile,again white blood cell count Plan: Lasix today, repeat chest x-ray in a.m.  Day number 2/7 Unasyn Trend pro calcitonin Wean oxygen Aspiration precautions Mobilize Routine pulmonary hygiene measures.  Resolving acute kidney injury Fluid and electrolyte imbalance: Hypokalemia Plan:  Replace potassium repeat chemistry in a.m.  Anemia, Without evidence of bleeding. Hemoglobin has drifted from 8.7-7.8 Plan: Continue subcutaneous heparin Trend CBC Transfuse per protocol  FAMILY  - Updates: Large family including son and daughter updated in consultation room in the emergency department. 10/11 no family at bedside  - Inter-disciplinary family meet or Palliative Care meeting due by:  October 18   02/05/2017, 7:47 AM

## 2017-02-05 NOTE — Plan of Care (Signed)
Problem: Education: Goal: Ability to manage disease process will improve Outcome: Not Progressing Plan of care explained to patient. Educated on prescribed medications and prescribed treatment regimen. Patient requires reinforcement of education

## 2017-02-05 NOTE — Progress Notes (Signed)
DAILY PROGRESS NOTE   Patient Name: Raven Martinez Date of Encounter: 02/05/2017  Hospital Problem List   Principal Problem:   Ventricular fibrillation [facility]) Active Problems:   Cardiac arrest ([place])   Acute respiratory failure with hypoxia (HCC)   Hypokalemia   Anoxic encephalopathy (HCC)   Acute systolic (congestive) heart failure (HCC)   QT prolongation    Chief Complaint   Mild shortness of breath   Subjective   Out of hospital VF arrest, subsequently underwent hypothermic protocol - left heart cath performed on 10/10 demonstrated normal coronaries, however LVEF 35% with global hypokinesis. Labs on admission significant for marked hypokalemia of 2.4 and hypocalcemia of 7.7, venous Lactate of 11, however, this was in the setting of prolonged down time. She was on no diuretics or medications that could be implicated at home. Fortunately, she has had neurologic recovery. Self-extubated overnight. She remains on IV amiodarone.  Objective   Vitals:   02/05/17 0500 02/05/17 0600 02/05/17 0700 02/05/17 0800  BP: (!) 145/86 115/76 (!) 141/91   Pulse: 77 72 75   Resp:      Temp:      TempSrc:      SpO2:    98%  Weight:      Height:        Intake/Output Summary (Last 24 hours) at 02/05/17 4854 Last data filed at 02/05/17 0800  Gross per 24 hour  Intake          1290.15 ml  Output             1050 ml  Net           240.15 ml   Filed Weights   02/02/17 0010 02/02/17 0430 02/04/17 0400  Weight: 132 lb 4.4 oz (60 kg) 113 lb 12.1 oz (51.6 kg) 124 lb 12.5 oz (56.6 kg)    Physical Exam   General appearance: alert, no distress and thin female Neck: JVD - 5 cm above sternal notch, no carotid bruit and thyroid not enlarged, symmetric, no tenderness/mass/nodules Lungs: diminished breath sounds bibasilar Heart: regular rate and rhythm, S1, S2 normal and systolic murmur: holosystolic 4/6, harsh at lower left sternal border Abdomen: soft, non-tender; bowel sounds normal; no  masses,  no organomegaly and scaphoid Extremities: extremities normal, atraumatic, no cyanosis or edema Pulses: 2+ and symmetric Skin: Skin color, texture, turgor normal. No rashes or lesions Neurologic: Mental status: Alert, oriented, thought content appropriate Psych: Pleasant  Inpatient Medications    Scheduled Meds: . aspirin EC  81 mg Oral Daily  . Chlorhexidine Gluconate Cloth  6 each Topical Daily  . folic acid  1 mg Oral Daily  . furosemide  40 mg Intravenous Once  . heparin  5,000 Units Subcutaneous Q8H  . potassium chloride  40 mEq Oral Once  . thiamine  100 mg Oral Daily    Continuous Infusions: . sodium chloride Stopped (02/03/17 2100)  . amiodarone 30 mg/hr (02/05/17 0800)  . ampicillin-sulbactam (UNASYN) IV Stopped (02/05/17 0430)    PRN Meds: hydrALAZINE, sodium chloride flush   Labs   Results for orders placed or performed during the hospital encounter of 02/01/17 (from the past 48 hour(s))  I-STAT 3, arterial blood gas (G3+)     Status: None   Collection Time: 02/03/17 10:06 AM  Result Value Ref Range   pH, Arterial 7.400 7.350 - 7.450   pCO2 arterial 36.3 32.0 - 48.0 mmHg   pO2, Arterial 91.0 83.0 - 108.0 mmHg  Bicarbonate 22.9 20.0 - 28.0 mmol/L   TCO2 24 22 - 32 mmol/L   O2 Saturation 98.0 %   Acid-base deficit 2.0 0.0 - 2.0 mmol/L   Patient temperature 35.2 C    Collection site ARTERIAL LINE    Drawn by Operator    Sample type ARTERIAL   Basic metabolic panel     Status: Abnormal   Collection Time: 02/03/17 12:00 PM  Result Value Ref Range   Sodium 133 (L) 135 - 145 mmol/L   Potassium 3.3 (L) 3.5 - 5.1 mmol/L   Chloride 101 101 - 111 mmol/L   CO2 25 22 - 32 mmol/L   Glucose, Bld 112 (H) 65 - 99 mg/dL   BUN 13 6 - 20 mg/dL   Creatinine, Ser 0.90 0.44 - 1.00 mg/dL   Calcium 6.5 (L) 8.9 - 10.3 mg/dL   GFR calc non Af Amer >60 >60 mL/min   GFR calc Af Amer >60 >60 mL/min    Comment: (NOTE) The eGFR has been calculated using the CKD EPI  equation. This calculation has not been validated in all clinical situations. eGFR's persistently <60 mL/min signify possible Chronic Kidney Disease.    Anion gap 7 5 - 15  Glucose, capillary     Status: Abnormal   Collection Time: 02/03/17 12:33 PM  Result Value Ref Range   Glucose-Capillary 115 (H) 65 - 99 mg/dL   Comment 1 Notify RN   Glucose, capillary     Status: Abnormal   Collection Time: 02/03/17  4:45 PM  Result Value Ref Range   Glucose-Capillary 127 (H) 65 - 99 mg/dL  Basic metabolic panel     Status: Abnormal   Collection Time: 02/03/17  5:10 PM  Result Value Ref Range   Sodium 133 (L) 135 - 145 mmol/L   Potassium 4.2 3.5 - 5.1 mmol/L    Comment: DELTA CHECK NOTED   Chloride 100 (L) 101 - 111 mmol/L   CO2 25 22 - 32 mmol/L   Glucose, Bld 127 (H) 65 - 99 mg/dL   BUN 12 6 - 20 mg/dL   Creatinine, Ser 1.15 (H) 0.44 - 1.00 mg/dL   Calcium 7.1 (L) 8.9 - 10.3 mg/dL   GFR calc non Af Amer 56 (L) >60 mL/min   GFR calc Af Amer >60 >60 mL/min    Comment: (NOTE) The eGFR has been calculated using the CKD EPI equation. This calculation has not been validated in all clinical situations. eGFR's persistently <60 mL/min signify possible Chronic Kidney Disease.    Anion gap 8 5 - 15  Glucose, capillary     Status: Abnormal   Collection Time: 02/03/17  7:56 PM  Result Value Ref Range   Glucose-Capillary 125 (H) 65 - 99 mg/dL   Comment 1 Capillary Specimen   Glucose, capillary     Status: None   Collection Time: 02/03/17 11:36 PM  Result Value Ref Range   Glucose-Capillary 93 65 - 99 mg/dL   Comment 1 Capillary Specimen   Glucose, capillary     Status: None   Collection Time: 02/04/17  3:41 AM  Result Value Ref Range   Glucose-Capillary 93 65 - 99 mg/dL   Comment 1 Capillary Specimen   Basic metabolic panel     Status: Abnormal   Collection Time: 02/04/17  4:43 AM  Result Value Ref Range   Sodium 132 (L) 135 - 145 mmol/L   Potassium 3.5 3.5 - 5.1 mmol/L   Chloride 99  (L) 101 -  111 mmol/L   CO2 26 22 - 32 mmol/L   Glucose, Bld 103 (H) 65 - 99 mg/dL   BUN 10 6 - 20 mg/dL   Creatinine, Ser 0.87 0.44 - 1.00 mg/dL   Calcium 7.3 (L) 8.9 - 10.3 mg/dL   GFR calc non Af Amer >60 >60 mL/min   GFR calc Af Amer >60 >60 mL/min    Comment: (NOTE) The eGFR has been calculated using the CKD EPI equation. This calculation has not been validated in all clinical situations. eGFR's persistently <60 mL/min signify possible Chronic Kidney Disease.    Anion gap 7 5 - 15  CBC     Status: Abnormal   Collection Time: 02/04/17  4:43 AM  Result Value Ref Range   WBC 8.6 4.0 - 10.5 K/uL   RBC 3.38 (L) 3.87 - 5.11 MIL/uL   Hemoglobin 8.7 (L) 12.0 - 15.0 g/dL   HCT 25.8 (L) 36.0 - 46.0 %   MCV 76.3 (L) 78.0 - 100.0 fL   MCH 25.7 (L) 26.0 - 34.0 pg   MCHC 33.7 30.0 - 36.0 g/dL   RDW 16.0 (H) 11.5 - 15.5 %   Platelets 110 (L) 150 - 400 K/uL    Comment: CONSISTENT WITH PREVIOUS RESULT  Magnesium     Status: None   Collection Time: 02/04/17  4:43 AM  Result Value Ref Range   Magnesium 2.2 1.7 - 2.4 mg/dL  Phosphorus     Status: None   Collection Time: 02/04/17  4:43 AM  Result Value Ref Range   Phosphorus 2.6 2.5 - 4.6 mg/dL  Blood gas, arterial     Status: Abnormal   Collection Time: 02/04/17  4:50 AM  Result Value Ref Range   FIO2 40.00    Delivery systems VENTILATOR    Mode PRESSURE REGULATED VOLUME CONTROL    VT 420 mL   LHR 20 resp/min   Peep/cpap 5.0 cm H20   pH, Arterial 7.401 7.350 - 7.450   pCO2 arterial 46.9 32.0 - 48.0 mmHg   pO2, Arterial 78.3 (L) 83.0 - 108.0 mmHg   Bicarbonate 28.5 (H) 20.0 - 28.0 mmol/L   Acid-Base Excess 4.0 (H) 0.0 - 2.0 mmol/L   O2 Saturation 95.8 %   Patient temperature 98.6    Collection site ARTERIAL LINE    Drawn by [phone]    Sample type ARTERIAL    Allens test (pass/fail) PASS PASS  Glucose, capillary     Status: None   Collection Time: 02/04/17  8:09 AM  Result Value Ref Range   Glucose-Capillary 84 65 - 99 mg/dL    Comment 1 Capillary Specimen   Culture, respiratory (NON-Expectorated)     Status: None (Preliminary result)   Collection Time: 02/04/17 11:15 AM  Result Value Ref Range   Specimen Description TRACHEAL ASPIRATE    Special Requests Normal    Gram Stain      ABUNDANT WBC PRESENT, PREDOMINANTLY PMN RARE SQUAMOUS EPITHELIAL CELLS PRESENT ABUNDANT GRAM POSITIVE COCCI IN PAIRS IN CLUSTERS RARE GRAM POSITIVE RODS    Culture PENDING    Report Status PENDING   Glucose, capillary     Status: None   Collection Time: 02/04/17 12:03 PM  Result Value Ref Range   Glucose-Capillary 93 65 - 99 mg/dL   Comment 1 Capillary Specimen   Procalcitonin - Baseline     Status: None   Collection Time: 02/04/17 12:27 PM  Result Value Ref Range   Procalcitonin 0.17 ng/mL  Comment:        Interpretation: PCT (Procalcitonin) <= 0.5 ng/mL: Systemic infection (sepsis) is not likely. Local bacterial infection is possible. (NOTE)         ICU PCT Algorithm               Non ICU PCT Algorithm    ----------------------------     ------------------------------         PCT < 0.25 ng/mL                 PCT < 0.1 ng/mL     Stopping of antibiotics            Stopping of antibiotics       strongly encouraged.               strongly encouraged.    ----------------------------     ------------------------------       PCT level decrease by               PCT < 0.25 ng/mL       >= 80% from peak PCT       OR PCT 0.25 - 0.5 ng/mL          Stopping of antibiotics                                             encouraged.     Stopping of antibiotics           encouraged.    ----------------------------     ------------------------------       PCT level decrease by              PCT >= 0.25 ng/mL       < 80% from peak PCT        AND PCT >= 0.5 ng/mL            Continuin g antibiotics                                              encouraged.       Continuing antibiotics            encouraged.    ----------------------------      ------------------------------     PCT level increase compared          PCT > 0.5 ng/mL         with peak PCT AND          PCT >= 0.5 ng/mL             Escalation of antibiotics                                          strongly encouraged.      Escalation of antibiotics        strongly encouraged.   Glucose, capillary     Status: None   Collection Time: 02/04/17  4:03 PM  Result Value Ref Range   Glucose-Capillary 95 65 - 99 mg/dL   Comment 1 Capillary Specimen   Glucose, capillary     Status: Abnormal   Collection Time: 02/04/17  7:39  PM  Result Value Ref Range   Glucose-Capillary 119 (H) 65 - 99 mg/dL   Comment 1 Capillary Specimen   Glucose, capillary     Status: Abnormal   Collection Time: 02/04/17 11:52 PM  Result Value Ref Range   Glucose-Capillary 101 (H) 65 - 99 mg/dL   Comment 1 Capillary Specimen   I-STAT 3, arterial blood gas (G3+)     Status: Abnormal   Collection Time: 02/05/17 12:22 AM  Result Value Ref Range   pH, Arterial 7.408 7.350 - 7.450   pCO2 arterial 47.6 32.0 - 48.0 mmHg   pO2, Arterial 110.0 (H) 83.0 - 108.0 mmHg   Bicarbonate 30.1 (H) 20.0 - 28.0 mmol/L   TCO2 31 22 - 32 mmol/L   O2 Saturation 98.0 %   Acid-Base Excess 5.0 (H) 0.0 - 2.0 mmol/L   Patient temperature 98.3 F    Sample type ARTERIAL   Basic metabolic panel     Status: Abnormal   Collection Time: 02/05/17  4:05 AM  Result Value Ref Range   Sodium 134 (L) 135 - 145 mmol/L   Potassium 3.4 (L) 3.5 - 5.1 mmol/L   Chloride 99 (L) 101 - 111 mmol/L   CO2 28 22 - 32 mmol/L   Glucose, Bld 101 (H) 65 - 99 mg/dL   BUN 10 6 - 20 mg/dL   Creatinine, Ser 0.88 0.44 - 1.00 mg/dL   Calcium 7.6 (L) 8.9 - 10.3 mg/dL   GFR calc non Af Amer >60 >60 mL/min   GFR calc Af Amer >60 >60 mL/min    Comment: (NOTE) The eGFR has been calculated using the CKD EPI equation. This calculation has not been validated in all clinical situations. eGFR's persistently <60 mL/min signify possible Chronic  Kidney Disease.    Anion gap 7 5 - 15  CBC     Status: Abnormal   Collection Time: 02/05/17  4:05 AM  Result Value Ref Range   WBC 8.7 4.0 - 10.5 K/uL   RBC 3.05 (L) 3.87 - 5.11 MIL/uL   Hemoglobin 7.8 (L) 12.0 - 15.0 g/dL   HCT 23.5 (L) 36.0 - 46.0 %   MCV 77.0 (L) 78.0 - 100.0 fL   MCH 25.6 (L) 26.0 - 34.0 pg   MCHC 33.2 30.0 - 36.0 g/dL   RDW 15.8 (H) 11.5 - 15.5 %   Platelets 99 (L) 150 - 400 K/uL    Comment: CONSISTENT WITH PREVIOUS RESULT  Magnesium     Status: None   Collection Time: 02/05/17  4:05 AM  Result Value Ref Range   Magnesium 1.9 1.7 - 2.4 mg/dL  Phosphorus     Status: None   Collection Time: 02/05/17  4:05 AM  Result Value Ref Range   Phosphorus 3.1 2.5 - 4.6 mg/dL  Procalcitonin     Status: None   Collection Time: 02/05/17  4:05 AM  Result Value Ref Range   Procalcitonin 0.19 ng/mL    Comment:        Interpretation: PCT (Procalcitonin) <= 0.5 ng/mL: Systemic infection (sepsis) is not likely. Local bacterial infection is possible. (NOTE)         ICU PCT Algorithm               Non ICU PCT Algorithm    ----------------------------     ------------------------------         PCT < 0.25 ng/mL  PCT < 0.1 ng/mL     Stopping of antibiotics            Stopping of antibiotics       strongly encouraged.               strongly encouraged.    ----------------------------     ------------------------------       PCT level decrease by               PCT < 0.25 ng/mL       >= 80% from peak PCT       OR PCT 0.25 - 0.5 ng/mL          Stopping of antibiotics                                             encouraged.     Stopping of antibiotics           encouraged.    ----------------------------     ------------------------------       PCT level decrease by              PCT >= 0.25 ng/mL       < 80% from peak PCT        AND PCT >= 0.5 ng/mL            Continuin g antibiotics                                              encouraged.       Continuing  antibiotics            encouraged.    ----------------------------     ------------------------------     PCT level increase compared          PCT > 0.5 ng/mL         with peak PCT AND          PCT >= 0.5 ng/mL             Escalation of antibiotics                                          strongly encouraged.      Escalation of antibiotics        strongly encouraged.   Glucose, capillary     Status: None   Collection Time: 02/05/17  4:12 AM  Result Value Ref Range   Glucose-Capillary 95 65 - 99 mg/dL   Comment 1 Arterial Specimen     ECG   Sinus at 99, IVCD, prolonged QTc at 528 msec, voltage for LVH, repolarization abnormality (02/02/17) - Personally Reviewed  Telemetry   Sinus rhythm - Personally Reviewed  Radiology    Ct Head Wo Contrast  Result Date: 02/03/2017 CLINICAL DATA:  Cardiac arrest EXAM: CT HEAD WITHOUT CONTRAST TECHNIQUE: Contiguous axial images were obtained from the base of the skull through the vertex without intravenous contrast. COMPARISON:  None. FINDINGS: Brain: No mass lesion, intraparenchymal hemorrhage or extra-axial collection. No evidence of acute cortical infarct. Brain parenchyma and CSF-containing spaces are normal for age. Vascular: No hyperdense vessel or unexpected  calcification. Skull: Normal visualized skull base, calvarium and extracranial soft tissues. Sinuses/Orbits: No sinus fluid levels or advanced mucosal thickening. No mastoid effusion. Normal orbits. IMPRESSION: Normal head CT. Electronically Signed   By: Ulyses Jarred M.D.   On: 02/03/2017 21:00   Dg Chest Port 1 View  Result Date: 02/05/2017 CLINICAL DATA:  Respiratory failure. EXAM: PORTABLE CHEST 1 VIEW COMPARISON:  Radiograph of February 04, 2017. FINDINGS: Stable cardiomegaly. Endotracheal tube has been removed. Left internal jugular catheter is noted with distal tip in expected position of the SVC. No pneumothorax is noted. Increased bilateral perihilar and basilar interstitial  densities are noted most consistent with edema or less likely inflammation. Mild bilateral pleural effusions are noted, left greater than right. Bony thorax is unremarkable. IMPRESSION: Endotracheal tube has been removed. Probably increased bilateral pulmonary edema or inflammation is noted in both lungs. Bilateral pleural effusions are noted as well. Electronically Signed   By: Marijo Conception, M.D.   On: 02/05/2017 07:25   Dg Chest Port 1 View  Result Date: 02/04/2017 CLINICAL DATA:  Endotracheal tube EXAM: PORTABLE CHEST 1 VIEW COMPARISON:  Portable exam 2031 hours compared to 02/04/2017 FINDINGS: Tip of endotracheal tube projects approximately 5.3 cm above carina. Esophageal probe present tip projecting over distal esophagus. LEFT jugular central venous catheter with tip projecting over SVC. External pacing leads in EKG leads present. Enlargement of cardiac silhouette. Mediastinal contours and pulmonary vascularity normal. Bibasilar and RIGHT upper lobe infiltrates. No pleural effusion or pneumothorax. Gas distention of stomach. IMPRESSION: BILATERAL pulmonary infiltrates at the bases and in the RIGHT upper lobe adjacent to minor fissure. Gas distention of stomach. Electronically Signed   By: Lavonia Dana M.D.   On: 02/04/2017 20:49   Dg Chest Port 1 View  Result Date: 02/04/2017 CLINICAL DATA:  Respiratory failure.  Intubated. EXAM: PORTABLE CHEST 1 VIEW COMPARISON:  Yesterday. FINDINGS: Stable enlarged cardiac silhouette. Interval increased opacity at both lung bases. Endotracheal tube in satisfactory position. Left jugular catheter tip in the superior vena cava. Nasogastric tube extending into the stomach. Unremarkable bones. IMPRESSION: 1. Increased bibasilar atelectasis and possible pneumonia. 2. Stable cardiomegaly. Electronically Signed   By: Claudie Revering M.D.   On: 02/04/2017 07:23    Cardiac Studies   Procedures   LEFT HEART CATH AND CORONARY ANGIOGRAPHY  Conclusion    Normal,  tortuous coronary arteries.  No evidence of obstructive coronary disease is identified.  Global LV hypokinesis with EF 35%. Marked elevation in LVEDP at 26 mmHg. Findings are compatible with acute combined systolic and diastolic heart failure. Systolic component possibly related to prolonged CPR.  Profound hypokalemia possibly the inciting element producing ventricular arrhythmia and cardiac arrest.  Recommendation:   IV nitroglycerin started for severe hypertension.  Replete potassium.  Management otherwise per critical care   Assessment   Principal Problem:   Ventricular fibrillation (HCC) Active Problems:   Cardiac arrest ([place])   Acute respiratory failure with hypoxia (HCC)   Hypokalemia   Anoxic encephalopathy (HCC)   Acute systolic (congestive) heart failure (HCC)   QT prolongation   Plan   1. VF arrest - The etiology of her VF arrest is unclear. She had notable metabolic abnormalities including hypokalemia and hypocalcemia after her arrest. It's not clear whether this was an inciting cause or whether related to a prolonged QTC. Her QT interval is 528 ms with an intraventricular conduction delay. There were no medications that clearly could've caused her arrest. She may ultimately need an AICD. DC  IV amiodarone. 2. Acute systolic congestive heart failure - LVEF 35% by cardiac catheterization. This is likely related to cardiac arrest. If there is persistent cardiomyopathy then AICD may also be indicated. We'll plan to check an echocardiogram as she additionally has a loud systolic murmur with thrill. She remains volume overloaded, about 5 L positive. We'll continue diuresis. Start spironolactone and hypokalemia benefit. 3. Murmur- she has a loud systolic murmur which could be VSD or acute severe MR. Check an echocardiogram ASAP today. 4. Aspiration pneumonia - Day 2/7 of Unasyn 5. Anemia - monitor hemoglobin - 7.8 today, would not transfuse unless Hb <7 6. Nutrition- okay  to advance diet as tolerated today.  Discussed with the PCCM service-we will primarily assume care of the patient as her issues are cardiac.  Time Spent Directly with Patient:  I have spent a total of 35 minutes with the patient reviewing hospital notes, telemetry, EKGs, labs and examining the patient as well as establishing an assessment and plan that was discussed personally with the patient. > 50% of time was spent in direct patient care.  Length of Stay:  LOS: 3 days   Pixie Casino, MD, [place]  Attending Cardiologist  Direct Dial: [phone]  Fax: [phone]  Website:  [email or web address]  Nadean Corwin Hilty 02/05/2017, 9:22 AM

## 2017-02-05 NOTE — Progress Notes (Signed)
Fentanyl 125 mL wasted in sink by myself, Hessie Dibble, RN and Ivery Quale.   Frutoso Chase, RN

## 2017-02-06 ENCOUNTER — Inpatient Hospital Stay (HOSPITAL_COMMUNITY): Payer: Medicaid Other

## 2017-02-06 LAB — CBC
HCT: 23.6 % — ABNORMAL LOW (ref 36.0–46.0)
Hemoglobin: 7.9 g/dL — ABNORMAL LOW (ref 12.0–15.0)
MCH: 25.7 pg — AB (ref 26.0–34.0)
MCHC: 33.5 g/dL (ref 30.0–36.0)
MCV: 76.9 fL — ABNORMAL LOW (ref 78.0–100.0)
PLATELETS: 114 10*3/uL — AB (ref 150–400)
RBC: 3.07 MIL/uL — AB (ref 3.87–5.11)
RDW: 14.9 % (ref 11.5–15.5)
WBC: 7.8 10*3/uL (ref 4.0–10.5)

## 2017-02-06 LAB — GLUCOSE, CAPILLARY
GLUCOSE-CAPILLARY: 105 mg/dL — AB (ref 65–99)
GLUCOSE-CAPILLARY: 140 mg/dL — AB (ref 65–99)
Glucose-Capillary: 111 mg/dL — ABNORMAL HIGH (ref 65–99)
Glucose-Capillary: 116 mg/dL — ABNORMAL HIGH (ref 65–99)

## 2017-02-06 LAB — BASIC METABOLIC PANEL
Anion gap: 8 (ref 5–15)
BUN: 8 mg/dL (ref 6–20)
CALCIUM: 8.3 mg/dL — AB (ref 8.9–10.3)
CO2: 31 mmol/L (ref 22–32)
CREATININE: 1.14 mg/dL — AB (ref 0.44–1.00)
Chloride: 95 mmol/L — ABNORMAL LOW (ref 101–111)
GFR calc non Af Amer: 56 mL/min — ABNORMAL LOW (ref >60)
GLUCOSE: 101 mg/dL — AB (ref 65–99)
Potassium: 3.2 mmol/L — ABNORMAL LOW (ref 3.5–5.1)
Sodium: 134 mmol/L — ABNORMAL LOW (ref 135–145)

## 2017-02-06 LAB — URINE CULTURE
Culture: NO GROWTH
Special Requests: NORMAL

## 2017-02-06 LAB — CULTURE, RESPIRATORY: SPECIAL REQUESTS: NORMAL

## 2017-02-06 LAB — CULTURE, RESPIRATORY W GRAM STAIN

## 2017-02-06 LAB — PROCALCITONIN: Procalcitonin: 0.3 ng/mL

## 2017-02-06 MED ORDER — BENZONATATE 100 MG PO CAPS
100.0000 mg | ORAL_CAPSULE | Freq: Three times a day (TID) | ORAL | Status: DC | PRN
  Administered 2017-02-06 – 2017-02-07 (×2): 100 mg via ORAL
  Filled 2017-02-06 (×2): qty 1

## 2017-02-06 MED ORDER — POTASSIUM CHLORIDE CRYS ER 10 MEQ PO TBCR
40.0000 meq | EXTENDED_RELEASE_TABLET | Freq: Three times a day (TID) | ORAL | Status: DC
  Administered 2017-02-06 – 2017-02-07 (×6): 40 meq via ORAL
  Filled 2017-02-06: qty 2
  Filled 2017-02-06: qty 4
  Filled 2017-02-06 (×2): qty 2
  Filled 2017-02-06: qty 4
  Filled 2017-02-06: qty 2

## 2017-02-06 MED ORDER — OXYCODONE HCL 5 MG PO TABS
5.0000 mg | ORAL_TABLET | Freq: Four times a day (QID) | ORAL | Status: DC | PRN
  Administered 2017-02-06 – 2017-02-07 (×3): 5 mg via ORAL
  Filled 2017-02-06 (×3): qty 1

## 2017-02-06 MED ORDER — GUAIFENESIN-DM 100-10 MG/5ML PO SYRP
5.0000 mL | ORAL_SOLUTION | ORAL | Status: DC | PRN
  Administered 2017-02-06: 5 mL via ORAL
  Filled 2017-02-06: qty 5

## 2017-02-06 MED ORDER — POTASSIUM CHLORIDE CRYS ER 20 MEQ PO TBCR: 40.0000 meq | EXTENDED_RELEASE_TABLET | Freq: Two times a day (BID) | ORAL | Status: DC

## 2017-02-06 MED ORDER — GUAIFENESIN-CODEINE 100-10 MG/5ML PO SOLN
10.0000 mL | Freq: Four times a day (QID) | ORAL | Status: DC | PRN
  Administered 2017-02-06: 10 mL via ORAL
  Filled 2017-02-06: qty 10

## 2017-02-06 MED ORDER — ACETAMINOPHEN 325 MG PO TABS
650.0000 mg | ORAL_TABLET | Freq: Four times a day (QID) | ORAL | Status: DC | PRN
  Administered 2017-02-06: 650 mg via ORAL
  Filled 2017-02-06: qty 2

## 2017-02-06 MED ORDER — AMLODIPINE BESYLATE 5 MG PO TABS
5.0000 mg | ORAL_TABLET | Freq: Every day | ORAL | Status: DC
  Administered 2017-02-06 – 2017-02-07 (×2): 5 mg via ORAL
  Filled 2017-02-06 (×2): qty 1
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
## 2017-02-06 NOTE — Progress Notes (Signed)
Patient c/o chest pain upon coughing. Received Robitussin DM last night, states not much relief. Paged PA Alben Spittle, placed orders for tylenol and tessalon. Will continue to monitor.

## 2017-02-06 NOTE — Progress Notes (Addendum)
Progress Note  Patient Name: Raven Martinez Date of Encounter: 02/06/2017  Primary Cardiologist: Dr. Rennis Golden  Subjective   No complaints of chest pain or SOB.  Complains of cough  Inpatient Medications    Scheduled Meds: . aspirin EC  81 mg Oral Daily  . carvedilol  3.125 mg Oral BID WC  . Chlorhexidine Gluconate Cloth  6 each Topical Daily  . folic acid  1 mg Oral Daily  . furosemide  40 mg Intravenous BID  . heparin  5,000 Units Subcutaneous Q8H  . spironolactone  25 mg Oral Daily  . thiamine  100 mg Oral Daily   Continuous Infusions: . sodium chloride Stopped (02/05/17 1900)  . ampicillin-sulbactam (UNASYN) IV Stopped (02/06/17 0555)   PRN Meds: acetaminophen, benzonatate, guaiFENesin-dextromethorphan, hydrALAZINE, sodium chloride flush   Vital Signs    Vitals:   02/05/17 2114 02/06/17 0001 02/06/17 0500 02/06/17 0743  BP: (!) 151/88 (!) 152/78 (!) 164/86 (!) 145/86  Pulse: 78 74 75 75  Resp: 20 19 (!) 25 (!) 24  Temp: 99.8 F (37.7 C) 99.8 F (37.7 C) 99.1 F (37.3 C) 98.9 F (37.2 C)  TempSrc: Oral Oral Oral Oral  SpO2: 100% 100% 100% 100%  Weight:   114 lb 4.8 oz (51.8 kg)   Height:        Intake/Output Summary (Last 24 hours) at 02/06/17 1610 Last data filed at 02/06/17 0806  Gross per 24 hour  Intake           778.16 ml  Output             2990 ml  Net         -2211.84 ml   Filed Weights   02/02/17 0430 02/04/17 0400 02/06/17 0500  Weight: 113 lb 12.1 oz (51.6 kg) 124 lb 12.5 oz (56.6 kg) 114 lb 4.8 oz (51.8 kg)    Telemetry    NSR - Personally Reviewed  ECG    NSR with LVH and repolarization abnormality - Personally Reviewed  Physical Exam   GEN: No acute distress.   Neck: No JVD Cardiac: RRR, no murmurs, rubs, or gallops.  Respiratory: crackles at bases bilaterally GI: Soft, nontender, non-distended  MS: No edema; No deformity. Neuro:  Nonfocal  Psych: Normal affect   Labs    Chemistry Recent Labs Lab 02/01/17 2315   02/04/17 0443 02/05/17 0405 02/06/17 0323  NA 131*  < > 132* 134* 134*  K 2.4*  < > 3.5 3.4* 3.2*  CL 102  < > 99* 99* 95*  CO2 11*  < > GLUCOSE 345*  < > 103* 101* 101*  BUN 10  < > CREATININE 1.07*  < > 0.87 0.88 1.14*  CALCIUM 7.7*  < > 7.3* 7.6* 8.3*  PROT 5.2*  --   --   --   --   ALBUMIN 3.2*  --   --   --   --   AST 38  --   --   --   --   ALT 15  --   --   --   --   ALKPHOS 47  --   --   --   --   BILITOT 0.8  --   --   --   --   GFRNONAA >60  < > >60 >60 56*  GFRAA >60  < > >60 >60 >60  ANIONGAP 18*  < > 7 7  8  < > = values in this interval not displayed.   Hematology Recent Labs Lab 02/04/17 0443 02/05/17 0405 02/06/17 0323  WBC 8.6 8.7 7.8  RBC 3.38* 3.05* 3.07*  HGB 8.7* 7.8* 7.9*  HCT 25.8* 23.5* 23.6*  MCV 76.3* 77.0* 76.9*  MCH 25.7* 25.6* 25.7*  MCHC 33.7 33.2 33.5  RDW 16.0* 15.8* 14.9  PLT 110* 99* 114*    Cardiac Enzymes Recent Labs Lab 02/02/17 0120 02/02/17 1020 02/02/17 1502 02/02/17 1949  TROPONINI 0.95* 1.33* 0.92* 0.56*    Recent Labs Lab 02/01/17 2315  TROPIPOC 0.12*     BNPNo results for input(s): BNP, PROBNP in the last 168 hours.   DDimer No results for input(s): DDIMER in the last 168 hours.   Radiology    Dg Chest Port 1 View  Result Date: 02/06/2017 CLINICAL DATA:  Follow-up examination for pneumonia. EXAM: PORTABLE CHEST 1 VIEW COMPARISON:  Prior radiograph from 02/05/2017. FINDINGS: Stable cardiomegaly.  Mediastinal silhouette normal. Lungs normally inflated. Improved pulmonary edema as compared to previous. Persistent mild diffuse pulmonary interstitial congestion. Small bilateral pleural effusions. Dense left lower lobe opacity with associated air bronchograms, which may reflect atelectasis or infiltrates. No other consolidative airspace disease. No pneumothorax. Osseous structures unchanged. IMPRESSION: 1. Persistent dense left lower lobe opacity, which may reflect atelectasis and/or consolidation.  2. Stable cardiomegaly with improved pulmonary edema as compared to previous. Persistent mild diffuse interstitial congestion. 3. Persistent bilateral pleural effusions, left larger than right. Electronically Signed   By: Rise Mu M.D.   On: 02/06/2017 06:54   Dg Chest Port 1 View  Result Date: 02/05/2017 CLINICAL DATA:  Respiratory failure. EXAM: PORTABLE CHEST 1 VIEW COMPARISON:  Radiograph of February 04, 2017. FINDINGS: Stable cardiomegaly. Endotracheal tube has been removed. Left internal jugular catheter is noted with distal tip in expected position of the SVC. No pneumothorax is noted. Increased bilateral perihilar and basilar interstitial densities are noted most consistent with edema or less likely inflammation. Mild bilateral pleural effusions are noted, left greater than right. Bony thorax is unremarkable. IMPRESSION: Endotracheal tube has been removed. Probably increased bilateral pulmonary edema or inflammation is noted in both lungs. Bilateral pleural effusions are noted as well. Electronically Signed   By: Lupita Raider, M.D.   On: 02/05/2017 07:25   Dg Chest Port 1 View  Result Date: 02/04/2017 CLINICAL DATA:  Endotracheal tube EXAM: PORTABLE CHEST 1 VIEW COMPARISON:  Portable exam 2031 hours compared to 02/04/2017 FINDINGS: Tip of endotracheal tube projects approximately 5.3 cm above carina. Esophageal probe present tip projecting over distal esophagus. LEFT jugular central venous catheter with tip projecting over SVC. External pacing leads in EKG leads present. Enlargement of cardiac silhouette. Mediastinal contours and pulmonary vascularity normal. Bibasilar and RIGHT upper lobe infiltrates. No pleural effusion or pneumothorax. Gas distention of stomach. IMPRESSION: BILATERAL pulmonary infiltrates at the bases and in the RIGHT upper lobe adjacent to minor fissure. Gas distention of stomach. Electronically Signed   By: Ulyses Southward M.D.   On: 02/04/2017 20:49    Cardiac  Studies   Procedures   LEFT HEART CATH AND CORONARY ANGIOGRAPHY  Conclusion    Normal, tortuous coronary arteries.  No evidence of obstructive coronary disease is identified.  Global LV hypokinesis with EF 35%. Marked elevation in LVEDP at 26 mmHg. Findings are compatible with acute combined systolic and diastolic heart failure. Systolic component possibly related to prolonged CPR.  Profound hypokalemia possibly the inciting element producing ventricular arrhythmia and cardiac arrest.  Recommendation:   IV nitroglycerin started for severe hypertension.  Replete potassium.  Management otherwise per critical care   2D echo Study Conclusions  - Left ventricle: The cavity size was normal. Systolic function was   normal. The estimated ejection fraction was in the range of 60%   to 65%. Wall motion was normal; there were no regional wall   motion abnormalities. There was an increased relative   contribution of atrial contraction to ventricular filling.   Doppler parameters are consistent with abnormal left ventricular   relaxation (grade 1 diastolic dysfunction). Doppler parameters   are consistent with high ventricular filling pressure. - Aortic valve: There was mild regurgitation. - Mitral valve: Moderate diffuse thickening of the anterior leaflet   and posterior leaflet. There was mild regurgitation. Valve area   by pressure half-time: 1.11 cm^2. - Left atrium: The atrium was severely dilated. - Pulmonic valve: There was trivial regurgitation.  Patient Profile     46 y.o. female s/p out of hospital VF arrest, subsequently underwent hypothermic protocol - left heart cath performed on 10/10 demonstrated normal coronaries, however LVEF 35% with global hypokinesis. Labs on admission significant for marked hypokalemia of 2.4 and hypocalcemia of 7.7, venous Lactate of 11, however, this was in the setting of prolonged down time. She was on no diuretics or medications that  could be implicated at home. Fortunately, she has had neurologic recovery. 2D echo showed normal LVF with EF 60-65% with mild AR and MR.   Assessment & Plan    1. VF arrest - The etiology of her VF arrest is unclear.  - She had notable metabolic abnormalities including hypokalemia and hypocalcemia after her arrest. It's not clear whether this was an inciting cause or whether related to a prolonged QTC. Her QT interval is 528 ms with an intraventricular conduction delay. There were no medications that clearly could've caused her arrest.  -  Initial EF at time of cath was 35% and likely related to severe acidosis with myocardial depression.  Repeat 2D echo showed normal LVF.   - will ask EP to consult for guidance tomorrow - ? Need for AICD although the electrolyte abnormalities could have been the inciting event. Will need to see if QTc corrects with corrections of electrolyte abnormalities.  - She has LVH on EKG which is likely related to her HTN and no evidence of HOCM on echo.  2. Acute systolic congestive heart failure  - LVEF 35% by cardiac catheterization. This is likely related to cardiac arrest and profound acidosis. - 2D echo post arrest shows normal LVF with EF 60-65%.   - she put out 2.5L yesterday but remains net positive 3.7L. - lungs still have crackles so will continue IV Lasix for now   3. Murmur - 2D echo showed only mild MR and TR  4.  HTN - BP remains elevated - need to rule out hyperaldo state with associated hypokalemia on presentation with no meds at home. - add amlodipine  daily for better BP control.   5.   Aspiration pneumonia - Day 2/7 of Unasy  6.   Anemia - monitor hemoglobin - 7.9 today (7.8>7.9). Would not transfuse unless Hb <7  7.   Hypokalemia - replete and repeat BMET in am. - patient has been hypertensive throughout her hospitalization with low potassium and was not on diuretics at home - ? Hyperaldosteronism.  I will check a plasma Aldo/renin ration  and Aldo level. - mag level normal.   -  increase Kdur to TID while diuresing  For questions or updates, please contact [facility] Please consult [email or web address] for contact info under Cardiology/STEMI.      Signed, Armanda Magic, MD  02/06/2017, 8:22 AM

## 2017-02-06 NOTE — Evaluation (Signed)
Physical Therapy Evaluation Patient Details Name: Raven Martinez MRN: [id number] DOB: 09-16-1970 Today's Date: 02/06/2017   History of Present Illness  46 y.o. female s/p out of hospital VF arrest, subsequently underwent hypothermic protocol - left heart cath performed on 10/10 demonstrated normal coronaries, however LVEF 35% with global hypokinesis. Marland Kitchen She was on no diuretics or medications that could be implicated at home. Fortunately, she has had neurologic recovery. 2D echo showed normal LVF with EF 60-65% with mild AR and MR.   Clinical Impression   Pt admitted with above diagnosis. Pt currently with functional limitations due to the deficits listed below (see PT Problem List). Presents with unsteady gait, overall fatigue limiting functional mobility; Needs gentle encouragement to increase independence with mobility and ADLs; Resistant to using an assistive device for amb;  Pt will benefit from skilled PT to increase their independence and safety with mobility to allow discharge to the venue listed below.       Follow Up Recommendations Home health PT;Supervision - Intermittent    Equipment Recommendations  Rolling walker with 5" wheels;3in1 (PT);Other (comment);Gilmer Mor (will likely be resistant to getting assistive device)    Recommendations for Other Services OT consult     Precautions / Restrictions Precautions Precautions: Fall Precaution Comments: monitor O2 sats Restrictions Weight Bearing Restrictions: No      Mobility  Bed Mobility Overal bed mobility: Needs Assistance Bed Mobility: Sit to Supine       Sit to supine: Min assist   General bed mobility comments: Min assist to help bil LEs in the bed  Transfers Overall transfer level: Needs assistance Equipment used: 1 person hand held assist Transfers: Sit to/from Stand Sit to Stand:  (Pt's neice helped pt stand from the toilet in bathroom)         General transfer comment: Assisted pt in sitting back down to  bed; cues for hand placement and safety  Ambulation/Gait Ambulation/Gait assistance: Min guard Ambulation Distance (Feet): 10 Feet Assistive device: 1 person hand held assist (and pushing IV pole) Gait Pattern/deviations: Decreased step length - right;Decreased step length - left;Decreased stride length;Trunk flexed Gait velocity: slowed   General Gait Details: Heavily dependent on UE support pushing IV pole; trunk flexed, in hobble-like posture; short step-length and height  Stairs            Wheelchair Mobility    Modified Rankin (Stroke Patients Only)       Balance Overall balance assessment: Needs assistance           Standing balance-Leahy Scale: Poor                               Pertinent Vitals/Pain Pain Assessment: No/denies pain    Home Living Family/patient expects to be discharged to:: Private residence Living Arrangements: Children Available Help at Discharge: Family;Available PRN/intermittently Type of Home: House Home Access: Stairs to enter   [employer] of Steps:  (to be determined) Home Layout: Two level;Bed/bath upstairs (this needs to be verified)        Prior Function Level of Independence: Independent               Hand Dominance        Extremity/Trunk Assessment   Upper Extremity Assessment Upper Extremity Assessment: Generalized weakness    Lower Extremity Assessment Lower Extremity Assessment: Generalized weakness (Reports R foot numbness as well)       Communication   Communication:  No difficulties  Cognition Arousal/Alertness: Awake/alert Behavior During Therapy: WFL for tasks assessed/performed Overall Cognitive Status: Within Functional Limits for tasks assessed                                        General Comments General comments (skin integrity, edema, etc.): walked in room on Room Air; O2 sats ranged form 89%-95%    Exercises     Assessment/Plan    PT  Assessment Patient needs continued PT services  PT Problem List Decreased strength;Decreased activity tolerance;Decreased balance;Decreased mobility;Decreased coordination;Decreased cognition;Decreased knowledge of use of DME;Decreased safety awareness;Decreased knowledge of precautions;Cardiopulmonary status limiting activity;Impaired sensation       PT Treatment Interventions DME instruction;Gait training;Stair training;Functional mobility training;Therapeutic activities;Therapeutic exercise;Balance training;Neuromuscular re-education;Cognitive remediation;Patient/family education    PT Goals (Current goals can be found in the Care Plan section)  Acute Rehab PT Goals Patient Stated Goal: get better PT Goal Formulation: With patient Time For Goal Achievement: 02/20/17 Potential to Achieve Goals: Good    Frequency Min 3X/week   Barriers to discharge Decreased caregiver support Son can assist pt prn; not quite 24 hours    Co-evaluation               AM-PAC PT "6 Clicks" Daily Activity  Outcome Measure Difficulty turning over in bed (including adjusting bedclothes, sheets and blankets)?: A Lot Difficulty moving from lying on back to sitting on the side of the bed? : A Lot Difficulty sitting down on and standing up from a chair with arms (e.g., wheelchair, bedside commode, etc,.)?: Unable Help needed moving to and from a bed to chair (including a wheelchair)?: A Little Help needed walking in hospital room?: A Little Help needed climbing 3-5 steps with a railing? : A Lot 6 Click Score: 13    End of Session   Activity Tolerance: Patient limited by fatigue Patient left: in bed;with call bell/phone within reach;with family/visitor present Nurse Communication: Mobility status PT Visit Diagnosis: Unsteadiness on feet (R26.81);Other abnormalities of gait and mobility (R26.89)    Time: 6962-9528 PT Time Calculation (min) (ACUTE ONLY): 11 min   Charges:   PT Evaluation $PT Eval  Moderate Complexity: 1 Mod     PT G Codes:        Van Clines, PT  Acute Rehabilitation Services Pager [phone] Office [phone]   Levi Aland 02/06/2017, 1:22 PM

## 2017-02-06 NOTE — Progress Notes (Signed)
Called to room because patient was having chest pain and trouble breathing. Upon assessment, patient is sitting up in bed, no signs of distress. Currently on 3 lpm nasal canula. Still has crackles at bases. EKG done. Told me she still has chest pain with coughing. PA Weaver informed, stopped the guaifenesin-codeine and started OXY IR 5 mg tablet PRN. Will continue to monitor.  Reassessed patient within 1 hour- currently resting and states she feels better.

## 2017-02-06 NOTE — Progress Notes (Addendum)
  Called by RN.  Patient did not have much relief with Tylenol and Tessalon Perles. Will give Robitussin AC (w/ codeine) every 6 hours as needed. Tereso Newcomer, PA-C    02/06/2017 11:20 AM    4:02 PM - RN called.  Patient still having pain with cough and no significant relief with Robitussin AC. EKG done.  Reviewed.  No changes.   Will give Oxycodone 5 mg Q 6 hours as needed. Tereso Newcomer, PA-C    02/06/2017 4:02 PM

## 2017-02-07 ENCOUNTER — Encounter (HOSPITAL_COMMUNITY): Payer: Self-pay | Admitting: *Deleted

## 2017-02-07 LAB — BASIC METABOLIC PANEL
Anion gap: 9 (ref 5–15)
BUN: 12 mg/dL (ref 6–20)
CHLORIDE: 97 mmol/L — AB (ref 101–111)
CO2: 32 mmol/L (ref 22–32)
CREATININE: 1.09 mg/dL — AB (ref 0.44–1.00)
Calcium: 8.6 mg/dL — ABNORMAL LOW (ref 8.9–10.3)
GFR, EST NON AFRICAN AMERICAN: 59 mL/min — AB (ref >60)
Glucose, Bld: 102 mg/dL — ABNORMAL HIGH (ref 65–99)
POTASSIUM: 3.8 mmol/L (ref 3.5–5.1)
SODIUM: 138 mmol/L (ref 135–145)

## 2017-02-07 LAB — CBC
HCT: 25 % — ABNORMAL LOW (ref 36.0–46.0)
Hemoglobin: 8.3 g/dL — ABNORMAL LOW (ref 12.0–15.0)
MCH: 25.4 pg — ABNORMAL LOW (ref 26.0–34.0)
MCHC: 33.2 g/dL (ref 30.0–36.0)
MCV: 76.5 fL — ABNORMAL LOW (ref 78.0–100.0)
PLATELETS: 137 10*3/uL — AB (ref 150–400)
RBC: 3.27 MIL/uL — AB (ref 3.87–5.11)
RDW: 15.1 % (ref 11.5–15.5)
WBC: 5.7 10*3/uL (ref 4.0–10.5)

## 2017-02-07 MED ORDER — AMLODIPINE BESYLATE 10 MG PO TABS
10.0000 mg | ORAL_TABLET | Freq: Every day | ORAL | Status: DC
  Administered 2017-02-08 – 2017-02-10 (×3): 10 mg via ORAL
  Filled 2017-02-07 (×3): qty 1

## 2017-02-07 NOTE — Evaluation (Signed)
Occupational Therapy Evaluation Patient Details Name: Raven Martinez MRN: [id number] DOB: 1970/09/03 Today's Date: 02/07/2017    History of Present Illness 46 y.o. female s/p out of hospital VF arrest, subsequently underwent hypothermic protocol - left heart cath performed on 10/10 demonstrated normal coronaries, however LVEF 35% with global hypokinesis. Labs on admission significant for marked hypokalemia of 2.4 and hypocalcemia of 7.7, venous Lactate of 11, however, this was in the setting of prolonged down time. She was on no diuretics or medications that could be implicated at home. Fortunately, she has had neurologic recovery. 2D echo showed normal LVF with EF 60-65% with mild AR and MR.    Clinical Impression   Pt is typically independent. She presents with generalized weakness, decreased activity tolerance and impaired standing balance. Pt prefers not to have to rely on DME for mobility or ADL. Anticipate she will progress well. Will follow.    Follow Up Recommendations  No OT follow up    Equipment Recommendations   (to be determined)    Recommendations for Other Services       Precautions / Restrictions Precautions Precautions: Fall Precaution Comments: monitor O2 sats      Mobility Bed Mobility Overal bed mobility: Needs Assistance Bed Mobility: Supine to Sit;Sit to Supine     Supine to sit: Supervision Sit to supine: Supervision   General bed mobility comments: assist to manage lines and bed linen  Transfers Overall transfer level: Needs assistance Equipment used: 1 person hand held assist Transfers: Sit to/from Stand Sit to Stand: Min guard         General transfer comment: steadying assist    Balance Overall balance assessment: Needs assistance   Sitting balance-Leahy Scale: Good Sitting balance - Comments: no LOB with donning socks     Standing balance-Leahy Scale: Poor                             ADL either performed or assessed  with clinical judgement   ADL Overall ADL's : Needs assistance/impaired Eating/Feeding: Independent;Sitting   Grooming: Wash/dry hands;Standing;Min guard   Upper Body Bathing: Set up;Sitting   Lower Body Bathing: Min guard;Sit to/from stand   Upper Body Dressing : Set up;Sitting   Lower Body Dressing: Min guard;Sit to/from stand   Toilet Transfer: Minimal assistance;Ambulation   Toileting- Clothing Manipulation and Hygiene: Min guard;Sit to/from stand       Functional mobility during ADLs: Minimal assistance       Vision Baseline Vision/History: No visual deficits Patient Visual Report: No change from baseline       Perception     Praxis      Pertinent Vitals/Pain Pain Assessment: 0-10 Pain Score: 9  Pain Location: L upper chest Pain Descriptors / Indicators: Sore Pain Intervention(s): Monitored during session;Premedicated before session     Hand Dominance Right   Extremity/Trunk Assessment Upper Extremity Assessment Upper Extremity Assessment: Overall WFL for tasks assessed   Lower Extremity Assessment Lower Extremity Assessment: Defer to PT evaluation   Cervical / Trunk Assessment Cervical / Trunk Assessment: Normal   Communication Communication Communication: No difficulties   Cognition Arousal/Alertness: Awake/alert Behavior During Therapy: WFL for tasks assessed/performed Overall Cognitive Status: Within Functional Limits for tasks assessed                                     General Comments  Exercises     Shoulder Instructions      Home Living Family/patient expects to be discharged to:: Private residence Living Arrangements: Children Available Help at Discharge: Family;Available PRN/intermittently Type of Home: House Home Access: Level entry     Home Layout: One level     Bathroom Shower/Tub: Chief Strategy Officer: Standard     Home Equipment: None          Prior Functioning/Environment  Level of Independence: Independent        Comments: drives        OT Problem List: Impaired balance (sitting and/or standing);Cardiopulmonary status limiting activity;Decreased knowledge of use of DME or AE;Pain      OT Treatment/Interventions: Self-care/ADL training;DME and/or AE instruction;Patient/family education    OT Goals(Current goals can be found in the care plan section) Acute Rehab OT Goals Patient Stated Goal: get better OT Goal Formulation: With patient Time For Goal Achievement: 02/21/17 Potential to Achieve Goals: Good ADL Goals Pt Will Perform Grooming: standing;with supervision Pt Will Perform Lower Body Bathing: with supervision;sit to/from stand Pt Will Perform Lower Body Dressing: with supervision;sit to/from stand Pt Will Transfer to Toilet: ambulating;regular height toilet;with supervision Pt Will Perform Toileting - Clothing Manipulation and hygiene: with supervision;sit to/from stand Pt Will Perform Tub/Shower Transfer: Tub transfer;with supervision;ambulating (determine need for DME)  OT Frequency: Min 2X/week   Barriers to D/C:            Co-evaluation              AM-PAC PT "6 Clicks" Daily Activity     Outcome Measure Help from another person eating meals?: None Help from another person taking care of personal grooming?: A Little Help from another person toileting, which includes using toliet, bedpan, or urinal?: A Little Help from another person bathing (including washing, rinsing, drying)?: A Little Help from another person to put on and taking off regular upper body clothing?: None Help from another person to put on and taking off regular lower body clothing?: A Little 6 Click Score: 20   End of Session Equipment Utilized During Treatment: Gait belt;Oxygen  Activity Tolerance: Patient tolerated treatment well Patient left: in bed;with call bell/phone within reach;with family/visitor present  OT Visit Diagnosis: Unsteadiness on feet  (R26.81)                Time: 1610-9604 OT Time Calculation (min): 16 min Charges:  OT General Charges $OT Visit: 1 Visit OT Evaluation $OT Eval Moderate Complexity: 1 Mod G-Codes:     2017-02-17 Martie Round, OTR/L Pager: [phone]  Iran Planas, [place] Bailiff 02/17/17, 8:26 AM

## 2017-02-07 NOTE — Progress Notes (Signed)
Progress Note  Patient Name: Raven Martinez Date of Encounter: 02/07/2017  Primary Cardiologist: Dr. Rennis Golden  Subjective   Still having a cough, but no significant chest pain or dyspnea. She does complain of soreness related to CPR.  Inpatient Medications    Scheduled Meds: . amLODipine  5 mg Oral Daily  . aspirin EC  81 mg Oral Daily  . carvedilol  3.125 mg Oral BID WC  . Chlorhexidine Gluconate Cloth  6 each Topical Daily  . folic acid  1 mg Oral Daily  . furosemide  40 mg Intravenous BID  . heparin  5,000 Units Subcutaneous Q8H  . potassium chloride  40 mEq Oral TID  . spironolactone  25 mg Oral Daily  . thiamine  100 mg Oral Daily   Continuous Infusions: . sodium chloride Stopped (02/05/17 1900)  . ampicillin-sulbactam (UNASYN) IV Stopped (02/07/17 0557)   PRN Meds: acetaminophen, benzonatate, guaiFENesin-dextromethorphan, hydrALAZINE, oxyCODONE, sodium chloride flush   Vital Signs    Vitals:   02/06/17 2015 02/07/17 0108 02/07/17 0516 02/07/17 0700  BP: 140/88 121/72 (!) 139/94   Pulse: 77 71 84   Resp: 19 (!) 22 17   Temp: 98.7 F (37.1 C) 98 F (36.7 C) 99.7 F (37.6 C) 99.3 F (37.4 C)  TempSrc: Oral Oral Oral Oral  SpO2: 100% 100% 90%   Weight:   111 lb 3.2 oz (50.4 kg)   Height:        Intake/Output Summary (Last 24 hours) at 02/07/17 2956 Last data filed at 02/07/17 2130  Gross per 24 hour  Intake             1670 ml  Output                0 ml  Net             1670 ml   Filed Weights   02/04/17 0400 02/06/17 0500 02/07/17 0516  Weight: 124 lb 12.5 oz (56.6 kg) 114 lb 4.8 oz (51.8 kg) 111 lb 3.2 oz (50.4 kg)    Telemetry    SR No significant arrhythmia - Personally Reviewed  ECG    SR with LVH and repol abnormality, Prolonged QT - Personally Reviewed  Physical Exam   General: Thin older AA, female appearing in no acute distress. Head: Normocephalic, atraumatic.  Neck: Supple without bruits, JVD. Lungs:  Resp regular and  unlabored, diminished in RLL.  Heart: RRR, S1, S2, no S3, S4, 3/6 systolic murmur; no rub. Abdomen: Soft, non-tender, non-distended with normoactive bowel sounds. Extremities: No clubbing, cyanosis, edema. Distal pedal pulses are 2+ bilaterally. Neuro: Alert and oriented X 3. Moves all extremities spontaneously. Psych: Normal affect.  Labs    Chemistry Recent Labs Lab 02/01/17 2315  02/05/17 0405 02/06/17 0323 02/07/17 0333  NA 131*  < > 134* 134* 138  K 2.4*  < > 3.4* 3.2* 3.8  CL 102  < > 99* 95* 97*  CO2 11*  < > 28 31 32  GLUCOSE 345*  < > 101* 101* 102*  BUN 10  < > CREATININE 1.07*  < > 0.88 1.14* 1.09*  CALCIUM 7.7*  < > 7.6* 8.3* 8.6*  PROT 5.2*  --   --   --   --   ALBUMIN 3.2*  --   --   --   --   AST 38  --   --   --   --   ALT  15  --   --   --   --   ALKPHOS 47  --   --   --   --   BILITOT 0.8  --   --   --   --   GFRNONAA >60  < > >60 56* 59*  GFRAA >60  < > >60 >60 >60  ANIONGAP 18*  < > < > = values in this interval not displayed.   Hematology Recent Labs Lab 02/05/17 0405 02/06/17 0323 02/07/17 0333  WBC 8.7 7.8 5.7  RBC 3.05* 3.07* 3.27*  HGB 7.8* 7.9* 8.3*  HCT 23.5* 23.6* 25.0*  MCV 77.0* 76.9* 76.5*  MCH 25.6* 25.7* 25.4*  MCHC 33.2 33.5 33.2  RDW 15.8* 14.9 15.1  PLT 99* 114* 137*    Cardiac Enzymes Recent Labs Lab 02/02/17 0120 02/02/17 1020 02/02/17 1502 02/02/17 1949  TROPONINI 0.95* 1.33* 0.92* 0.56*    Recent Labs Lab 02/01/17 2315  TROPIPOC 0.12*     BNPNo results for input(s): BNP, PROBNP in the last 168 hours.   DDimer No results for input(s): DDIMER in the last 168 hours.    Radiology    Dg Chest Port 1 View  Result Date: 02/06/2017 CLINICAL DATA:  Follow-up examination for pneumonia. EXAM: PORTABLE CHEST 1 VIEW COMPARISON:  Prior radiograph from 02/05/2017. FINDINGS: Stable cardiomegaly.  Mediastinal silhouette normal. Lungs normally inflated. Improved pulmonary edema as compared to previous.  Persistent mild diffuse pulmonary interstitial congestion. Small bilateral pleural effusions. Dense left lower lobe opacity with associated air bronchograms, which may reflect atelectasis or infiltrates. No other consolidative airspace disease. No pneumothorax. Osseous structures unchanged. IMPRESSION: 1. Persistent dense left lower lobe opacity, which may reflect atelectasis and/or consolidation. 2. Stable cardiomegaly with improved pulmonary edema as compared to previous. Persistent mild diffuse interstitial congestion. 3. Persistent bilateral pleural effusions, left larger than right. Electronically Signed   By: Rise Mu M.D.   On: 02/06/2017 06:54    Cardiac Studies   Procedures   LEFT HEART CATH AND CORONARY ANGIOGRAPHY  Conclusion    Normal, tortuous coronary arteries.  No evidence of obstructive coronary disease is identified.  Global LV hypokinesis with EF 35%. Marked elevation in LVEDP at 26 mmHg. Findings are compatible with acute combined systolic and diastolic heart failure. Systolic component possibly related to prolonged CPR.  Profound hypokalemia possibly the inciting element producing ventricular arrhythmia and cardiac arrest.  Recommendation:   IV nitroglycerin started for severe hypertension.  Replete potassium.  Management otherwise per critical care   2D echo Study Conclusions  - Left ventricle: The cavity size was normal. Systolic function was normal. The estimated ejection fraction was in the range of 60% to 65%. Wall motion was normal; there were no regional wall motion abnormalities. There was an increased relative contribution of atrial contraction to ventricular filling. Doppler parameters are consistent with abnormal left ventricular relaxation (grade 1 diastolic dysfunction). Doppler parameters are consistent with high ventricular filling pressure. - Aortic valve: There was mild regurgitation. - Mitral valve: Moderate  diffuse thickening of the anterior leaflet and posterior leaflet. There was mild regurgitation. Valve area by pressure half-time: 1.11 cm^2. - Left atrium: The atrium was severely dilated. - Pulmonic valve: There was trivial regurgitation.   Patient Profile     46 y.o. female s/p out of hospital VF arrest, subsequently underwent hypothermic protocol - left heart cath performed on 10/10 demonstrated normal coronaries, however LVEF 35% with global hypokinesis. Labs on admission significant  for marked hypokalemia of 2.4 and hypocalcemia of 7.7, venous Lactate of 11, however, this was in the setting of prolonged down time. She was on no diuretics or medications that could be implicated at home. Fortunately, she has had neurologic recovery. 2D echo showed normal LVF with EF 60-65% with mild AR and MR.   Assessment & Plan    1. VF arrest - The etiology of her VF arrest is unclear. Had notable metabolic abnormalities including hypokalemia and hypocalcemia after her arrest. It's not clear whether this was an inciting cause or whether related to a prolonged QTC. Her QT interval was 528 with an intraventricular conduction delay. There were no medications that clearly could've caused her arrest.  -  Initial EF at time of cath was 35% and likely related to severe acidosis with myocardial depression.  Repeat 2D echo showed recovered normal LVF.   - She has LVH on EKG which is likely related to her HTN and no evidence of HOCM on echo. - Plan for EP consult today with question for need of AICD although the electrolyte abnormalities could have been the inciting event.  2. Acute systolic congestive heart failure  - LVEF 35% by cardiac catheterization. This was likely related to cardiac arrest and profound acidosis. - 2D echo post arrest showed normal LVF with EF 60-65%.   - has been on IV lasix, with good UOP thus far, but only 400cc documented yesterday. Lung sounds diminished on the right side today with  mild crackles. Will continue lasix additional day.   3. Murmur - 2D echo showed only mild MR and TR  4.  HTN - BP remains elevated - need to rule out hyperaldo state with associated hypokalemia on presentation with no meds at home. - will increase amlodipine to  daily for better BP control.   5.   Aspiration pneumonia -Day 3/7 of [place]  6.   Anemia - monitor hemoglobin - 8.3 today (7.8>7.9). Would not transfuse unless Hb <7  7.   Hypokalemia - stable this am.  - mag level normal.   - Kdur TID while diuresing - plasma Aldo/renin ration and Aldo level pending as she has had recurrent hypokalemia and not a on a diuretic at home,? Hyperaldosteronism  Signed, Laverda Page, NP  02/07/2017, 8:32 AM  Pager # [phone]   Patient seen, examined. Available data reviewed. Agree with findings, assessment, and plan as outlined by Laverda Page, NP-C. Changes to documentation made above where appropriate. The patient appears to have had a VF cardiac arrest in the setting of severe hypokalemia. The patient's initial QT interval was markedly prolonged and it is now improved with correction of her hypokalemia, still with a QTC of 487 ms on today's EKG. Appreciate evaluation of the EP service will await any further recommendations. Notably the patient has no family history of sudden cardiac death. Her father sounds like he had congestive heart failure. Will continue with IV diuresis based on her chest x-ray findings of pleural effusions and edema. Repeat chest x-ray tomorrow morning.  Tonny Bollman, M.D. 02/07/2017 12:24 PM   For questions or updates, please contact [facility] Please consult [email or web address] for contact info under Cardiology/STEMI. Daytime calls, contact the Day Call APP (6a-8a) or assigned team (Teams A-D) provider (7:30a - 5p). All other daytime calls (7:30-5p), contact the Card Master @ [phone].   Nighttime calls, contact the assigned APP (5p-8p) or MD  (6:30p-8p). Overnight calls (8p-6a), contact the on call Fellow @ [phone].

## 2017-02-08 ENCOUNTER — Inpatient Hospital Stay (HOSPITAL_COMMUNITY): Payer: Medicaid Other

## 2017-02-08 DIAGNOSIS — I469 Cardiac arrest, cause unspecified: Secondary | ICD-10-CM

## 2017-02-08 DIAGNOSIS — R9431 Abnormal electrocardiogram [ECG] [EKG]: Secondary | ICD-10-CM

## 2017-02-08 LAB — BASIC METABOLIC PANEL
ANION GAP: 10 (ref 5–15)
Anion gap: 12 (ref 5–15)
BUN: 12 mg/dL (ref 6–20)
BUN: 18 mg/dL (ref 6–20)
CALCIUM: 9.9 mg/dL (ref 8.9–10.3)
CALCIUM: 10.1 mg/dL (ref 8.9–10.3)
CHLORIDE: 97 mmol/L — AB (ref 101–111)
CO2: 29 mmol/L (ref 22–32)
CO2: 28 mmol/L (ref 22–32)
CREATININE: 1.31 mg/dL — AB (ref 0.44–1.00)
Chloride: 100 mmol/L — ABNORMAL LOW (ref 101–111)
Creatinine, Ser: 1.34 mg/dL — ABNORMAL HIGH (ref 0.44–1.00)
GFR calc Af Amer: 54 mL/min — ABNORMAL LOW (ref >60)
GFR calc non Af Amer: 48 mL/min — ABNORMAL LOW (ref >60)
GFR, EST AFRICAN AMERICAN: 55 mL/min — AB (ref >60)
GFR, EST NON AFRICAN AMERICAN: 46 mL/min — AB (ref >60)
GLUCOSE: 130 mg/dL — AB (ref 65–99)
Glucose, Bld: 88 mg/dL (ref 65–99)
POTASSIUM: 4.6 mmol/L (ref 3.5–5.1)
Potassium: 5.3 mmol/L — ABNORMAL HIGH (ref 3.5–5.1)
SODIUM: 138 mmol/L (ref 135–145)
Sodium: 138 mmol/L (ref 135–145)

## 2017-02-08 LAB — CBC
HCT: 30.2 % — ABNORMAL LOW (ref 36.0–46.0)
Hemoglobin: 9.9 g/dL — ABNORMAL LOW (ref 12.0–15.0)
MCH: 24.8 pg — ABNORMAL LOW (ref 26.0–34.0)
MCHC: 32.8 g/dL (ref 30.0–36.0)
MCV: 75.5 fL — ABNORMAL LOW (ref 78.0–100.0)
PLATELETS: 193 10*3/uL (ref 150–400)
RBC: 4 MIL/uL (ref 3.87–5.11)
RDW: 14.7 % (ref 11.5–15.5)
WBC: 6.6 10*3/uL (ref 4.0–10.5)

## 2017-02-08 LAB — GLUCOSE, CAPILLARY
GLUCOSE-CAPILLARY: 138 mg/dL — AB (ref 65–99)
Glucose-Capillary: 93 mg/dL (ref 65–99)
Glucose-Capillary: 139 mg/dL — ABNORMAL HIGH (ref 65–99)

## 2017-02-08 MED ORDER — AMPICILLIN-SULBACTAM SODIUM 3 (2-1) G IJ SOLR
3.0000 g | Freq: Three times a day (TID) | INTRAMUSCULAR | Status: DC
  Administered 2017-02-08 – 2017-02-10 (×5): 3 g via INTRAVENOUS
  Filled 2017-02-08 (×7): qty 3

## 2017-02-08 MED ORDER — GADOBENATE DIMEGLUMINE 529 MG/ML IV SOLN
15.0000 mL | Freq: Once | INTRAVENOUS | Status: AC
  Administered 2017-02-08: 15 mL via INTRAVENOUS

## 2017-02-08 MED ORDER — LORAZEPAM 1 MG PO TABS
1.0000 mg | ORAL_TABLET | Freq: Once | ORAL | Status: AC
  Administered 2017-02-08: 1 mg via ORAL
  Filled 2017-02-08: qty 1

## 2017-02-08 NOTE — Progress Notes (Signed)
Physical Therapy Treatment Patient Details Name: Raven Martinez MRN: [id number] DOB: Apr 12, 1971 Today's Date: 02/08/2017    History of Present Illness 46 y.o. female s/p out of hospital VF arrest, subsequently underwent hypothermic protocol - left heart cath performed on 10/10 demonstrated normal coronaries, however LVEF 35% with global hypokinesis. Labs on admission significant for marked hypokalemia of 2.4 and hypocalcemia of 7.7, venous Lactate of 11, however, this was in the setting of prolonged down time. She was on no diuretics or medications that could be implicated at home. Fortunately, she has had neurologic recovery. 2D echo showed normal LVF with EF 60-65% with mild AR and MR.     PT Comments    Pt progressing with ambulation distance today, 300', but continues to have balance deficits and HR up to 131 bpm with slow gait. Pt reports that her R foot is numb since being admitted and this is contributing to deficits. PT will continue to follow.    Follow Up Recommendations  Home health PT;Supervision - Intermittent     Equipment Recommendations  3in1 (PT);Other (comment);Gilmer Mor (will likely be resistant to getting assistive device)    Recommendations for Other Services       Precautions / Restrictions Precautions Precautions: Fall Precaution Comments: monitor O2 sats Restrictions Weight Bearing Restrictions: No    Mobility  Bed Mobility Overal bed mobility: Modified Independent Bed Mobility: Supine to Sit;Sit to Supine     Supine to sit: Modified independent (Device/Increase time) Sit to supine: Modified independent (Device/Increase time)   General bed mobility comments: getting up and down independently  Transfers Overall transfer level: Needs assistance Equipment used: 1 person hand held assist Transfers: Sit to/from Stand Sit to Stand: Min guard         General transfer comment: close guarding as pt unsteady  Ambulation/Gait Ambulation/Gait assistance:  Min guard Ambulation Distance (Feet): 300 Feet Assistive device: None Gait Pattern/deviations: Decreased step length - right;Decreased step length - left;Decreased stride length;Staggering left;Staggering right Gait velocity: slowed Gait velocity interpretation: Below normal speed for age/gender General Gait Details: pt staggers right>left, she feels due to numbness R foot. She reports that this is new since arriving in the hospital.    Stairs            Wheelchair Mobility    Modified Rankin (Stroke Patients Only)       Balance Overall balance assessment: Needs assistance Sitting-balance support: No upper extremity supported Sitting balance-Leahy Scale: Good     Standing balance support: No upper extremity supported Standing balance-Leahy Scale: Fair Standing balance comment: pt can maintain dynamic balance but in controlled environment, loses balance with unexpected perturbations                            Cognition Arousal/Alertness: Awake/alert Behavior During Therapy: WFL for tasks assessed/performed Overall Cognitive Status: Within Functional Limits for tasks assessed                                        Exercises      General Comments General comments (skin integrity, edema, etc.): HR up to 131 bpm with ambulation. O2 sats stable      Pertinent Vitals/Pain Pain Assessment: No/denies pain    Home Living  Prior Function            PT Goals (current goals can now be found in the care plan section) Acute Rehab PT Goals Patient Stated Goal: get better PT Goal Formulation: With patient Time For Goal Achievement: 02/20/17 Potential to Achieve Goals: Good Progress towards PT goals: Progressing toward goals    Frequency    Min 3X/week      PT Plan Current plan remains appropriate    Martinez-evaluation              AM-PAC PT "6 Clicks" Daily Activity  Outcome Measure  Difficulty  turning over in bed (including adjusting bedclothes, sheets and blankets)?: None Difficulty moving from lying on back to sitting on the side of the bed? : None Difficulty sitting down on and standing up from a chair with arms (e.g., wheelchair, bedside commode, etc,.)?: A Little     Help needed climbing 3-5 steps with a railing? : A Little 6 Click Score: 14    End of Session   Activity Tolerance: Patient tolerated treatment well Patient left: with call bell/phone within reach;with family/visitor present;in chair Nurse Communication: Mobility status PT Visit Diagnosis: Unsteadiness on feet (R26.81);Other abnormalities of gait and mobility (R26.89)     Time: 1610-9604 PT Time Calculation (min) (ACUTE ONLY): 23 min  Charges:  $Gait Training: 23-37 mins                    G Codes:       Raven Martinez, PT  Acute Rehab Services  [phone]    Raven Martinez Raven Martinez 02/08/2017, 4:50 PM

## 2017-02-08 NOTE — Progress Notes (Signed)
Progress Note  Patient Name: Raven Martinez Date of Encounter: 02/08/2017  Primary Cardiologist: Hilty  Subjective   Chest soreness is improving. Breathing is much better today.   Inpatient Medications    Scheduled Meds: . amLODipine  10 mg Oral Daily  . aspirin EC  81 mg Oral Daily  . carvedilol  3.125 mg Oral BID WC  . Chlorhexidine Gluconate Cloth  6 each Topical Daily  . folic acid  1 mg Oral Daily  . furosemide  40 mg Intravenous BID  . heparin  5,000 Units Subcutaneous Q8H  . potassium chloride  40 mEq Oral TID  . spironolactone  25 mg Oral Daily  . thiamine  100 mg Oral Daily   Continuous Infusions: . sodium chloride Stopped (02/05/17 1900)  . ampicillin-sulbactam (UNASYN) IV Stopped (02/08/17 0430)   PRN Meds: acetaminophen, benzonatate, guaiFENesin-dextromethorphan, hydrALAZINE, oxyCODONE, sodium chloride flush   Vital Signs    Vitals:   02/07/17 2043 02/07/17 2246 02/08/17 0031 02/08/17 0638  BP: (!) 163/85  (!) 153/112 (!) 138/103  Pulse: 87  84 82  Resp: 18     Temp: 98.7 F (37.1 C)  99.3 F (37.4 C) 98.5 F (36.9 C)  TempSrc: Oral  Oral Oral  SpO2: 100% 100% 95% 100%  Weight:    102 lb 11.2 oz (46.6 kg)  Height:        Intake/Output Summary (Last 24 hours) at 02/08/17 1123 Last data filed at 02/08/17 0400  Gross per 24 hour  Intake             1160 ml  Output                0 ml  Net             1160 ml   Filed Weights   02/06/17 0500 02/07/17 0516 02/08/17 0638  Weight: 114 lb 4.8 oz (51.8 kg) 111 lb 3.2 oz (50.4 kg) 102 lb 11.2 oz (46.6 kg)    Telemetry    SR - Personally Reviewed   Physical Exam   General: Very thin AA female appearing in no acute distress. Head: Normocephalic, atraumatic.  Neck: Supple without bruits, JVD. Lungs:  Resp regular and unlabored, CTA. Heart: RRR, S1, S2, no S3, S4, 3/6 systolic murmur; no rub. Abdomen: Soft, non-tender, non-distended with normoactive bowel sounds. Extremities: No clubbing,  cyanosis, edema. Distal pedal pulses are 2+ bilaterally. Neuro: Alert and oriented X 3. Moves all extremities spontaneously. Psych: Normal affect.  Labs    Chemistry Recent Labs Lab 02/01/17 2315  02/06/17 0323 02/07/17 0333 02/08/17 0538  NA 131*  < > 134* 138 138  K 2.4*  < > 3.2* 3.8 5.3*  CL 102  < > 95* 97* 97*  CO2 11*  < > 31 32 29  GLUCOSE 345*  < > 101* 102* 88  BUN 10  < > CREATININE 1.07*  < > 1.14* 1.09* 1.31*  CALCIUM 7.7*  < > 8.3* 8.6* 9.9  PROT 5.2*  --   --   --   --   ALBUMIN 3.2*  --   --   --   --   AST 38  --   --   --   --   ALT 15  --   --   --   --   ALKPHOS 47  --   --   --   --   BILITOT 0.8  --   --   --   --  GFRNONAA >60  < > 56* 59* 48*  GFRAA >60  < > >60 >60 55*  ANIONGAP 18*  < > < > = values in this interval not displayed.   Hematology Recent Labs Lab 02/06/17 0323 02/07/17 0333 02/08/17 0538  WBC 7.8 5.7 6.6  RBC 3.07* 3.27* 4.00  HGB 7.9* 8.3* 9.9*  HCT 23.6* 25.0* 30.2*  MCV 76.9* 76.5* 75.5*  MCH 25.7* 25.4* 24.8*  MCHC 33.5 33.2 32.8  RDW 14.9 15.1 14.7  PLT 114* 137* 193    Cardiac Enzymes Recent Labs Lab 02/02/17 0120 02/02/17 1020 02/02/17 1502 02/02/17 1949  TROPONINI 0.95* 1.33* 0.92* 0.56*    Recent Labs Lab 02/01/17 2315  TROPIPOC 0.12*     BNPNo results for input(s): BNP, PROBNP in the last 168 hours.   DDimer No results for input(s): DDIMER in the last 168 hours.    Radiology    Dg Chest 2 View  Result Date: 02/08/2017 CLINICAL DATA:  Shortness of Breath EXAM: CHEST  2 VIEW COMPARISON:  02/06/2017 FINDINGS: Cardiac shadow is enlarged. The lungs are well aerated bilaterally. Mild left basilar atelectasis is seen but improved from the prior study. Nipple shadow is noted over the right base. No bony abnormality is seen. IMPRESSION: Improving but persistent atelectatic changes in the left base. Electronically Signed   By: Alcide Clever M.D.   On: 02/08/2017 08:24    Cardiac  Studies   Procedures   LEFT HEART CATH AND CORONARY ANGIOGRAPHY  Conclusion    Normal, tortuous coronary arteries.  No evidence of obstructive coronary disease is identified.  Global LV hypokinesis with EF 35%. Marked elevation in LVEDP at 26 mmHg. Findings are compatible with acute combined systolic and diastolic heart failure. Systolic component possibly related to prolonged CPR.  Profound hypokalemia possibly the inciting element producing ventricular arrhythmia and cardiac arrest.  Recommendation:   IV nitroglycerin started for severe hypertension.  Replete potassium.  Management otherwise per critical care   2D echo Study Conclusions  - Left ventricle: The cavity size was normal. Systolic function was normal. The estimated ejection fraction was in the range of 60% to 65%. Wall motion was normal; there were no regional wall motion abnormalities. There was an increased relative contribution of atrial contraction to ventricular filling. Doppler parameters are consistent with abnormal left ventricular relaxation (grade 1 diastolic dysfunction). Doppler parameters are consistent with high ventricular filling pressure. - Aortic valve: There was mild regurgitation. - Mitral valve: Moderate diffuse thickening of the anterior leaflet and posterior leaflet. There was mild regurgitation. Valve area by pressure half-time: 1.11 cm^2. - Left atrium: The atrium was severely dilated. - Pulmonic valve: There was trivial regurgitation.   Patient Profile     46 y.o. female  s/p out of hospital VF arrest, subsequently underwent hypothermic protocol - left heart cath performed on 10/10 demonstrated normal coronaries, however LVEF 35% with global hypokinesis. Labs on admission significant for marked hypokalemia of 2.4 and hypocalcemia of 7.7, venous Lactate of 11, however, this was in the setting of prolonged down time. She was on no diuretics or medications that  could be implicated at home. Fortunately, she has had neurologic recovery. 2D echo showed normal LVF with EF 60-65% with mild AR and MR.   Assessment & Plan    1. VF arrest - The etiology of her VF arrest was unclear, with notable metabolic abnormalities including hypokalemia and hypocalcemia after her arrest. Her QT interval was 528 with an  intraventricular conduction delay, but improving on EKG yesterday with QTC 487.  - Initial EF at time of cath was 35% and likely related to severe acidosis with myocardial depression. Repeat 2D echo showed recovered normal LVF.  - She has LVH on EKG which is likely related to her HTN and no evidence of HOCM on echo. - EP consulted yesterday and planned for cardiac MRI and ICD placement on Friday.  2. Acute systolic congestive heart failure  - LVEF 35% by cardiac catheterization. This was likely related to cardiac arrest and profound acidosis. - 2D echo post arrest showed normal LVF with EF 60-65%.  - has been on IV lasix, weight trending down. CXR this morning without significant edema. Cr now starting to rise.  Will stop lasix today as she appears volume stable.   3. Murmur - 2D echo showed only mild MR and TR  4. HTN - BP remains elevated - does not appear to have received blood pressure medications this am. Will hold on adjusting medication for day.  5. Aspiration pneumonia -Day 4/7 of Unasy, last BC on 10/12 show no growth. DC  antibiotics?  6. Anemia - monitor hemoglobin - improving 9.9 today.  7. Hypokalemia - now hyperkalemic this morning with K 5.3. Will hold K+ supplement and aldactone this morning.  -- recheck BMET this afternoon     Signed, Laverda Page, NP  02/08/2017, 11:23 AM  Pager # [phone]   For questions or updates, please contact [facility] Please consult [email or web address] for contact info under Cardiology/STEMI. Daytime calls, contact the Day Call APP (6a-8a) or assigned team (Teams A-D) provider  (7:30a - 5p). All other daytime calls (7:30-5p), contact the Card Master @ [phone].   Nighttime calls, contact the assigned APP (5p-8p) or MD (6:30p-8p). Overnight calls (8p-6a), contact the on call Fellow @ [phone].  I have personally seen and examined this patient. I agree with the assessment and plan as outlined above.  She had a v fib arrest in the setting of severe hypokalemia and QTC was prolonged. QTC is improved with replacement of potassium. Normal coronary arteries by cath. Echo with normalization of LV systolic function. She has been seen by EP. Plans for cardiac MRI and ICD later this week.   Verne Carrow 02/08/2017 12:21 PM

## 2017-02-08 NOTE — Progress Notes (Signed)
Pharmacy Consult - Renal Adjustment of Antibiotics  46 y.o female presented on 02/01/17 with Ventricular fibrillation arrest. Unasyn 3 g IV q6h was started on 02/04/17 for aspiration pneumonia. The patient's SCr = 1.31, increased from 1.09,  with a calculated CrCl ~39 ml/min.     Renal dose adjustment of Unasyn - pharmacy has adjusted Unasyn 3g from q6h interval  to q8h dosing interval due to Crcl 47ml/min , SCr increased to 1.31  (1.14>1.09>1.31  Day # 5/7 Unasyn.   Unasyn 10/12 >>  Microbiology;  10/12 BCx x2:  ngtd  10/12 Trach aspir Cx: MSSA  10/12 UCx : neg  10/10 MRSA PCR; neg  Pharmacy will sign off.  Noah Delaine, RPh Clinical Pharmacist Pager: [phone] 8a-330p [phone]-[phone] phone [phone] or [phone] Main pharmacy [phone] 02/08/2017 12:49 PM

## 2017-02-09 ENCOUNTER — Encounter (HOSPITAL_COMMUNITY)
Admission: EM | Disposition: A | Discharge status: Home / Self Care | Payer: Self-pay | Source: Home / Self Care | Attending: Internal Medicine

## 2017-02-09 HISTORY — PX: ICD IMPLANT: EP1208

## 2017-02-09 LAB — CULTURE, BLOOD (ROUTINE X 2)
Culture: NO GROWTH
Culture: NO GROWTH
SPECIAL REQUESTS: ADEQUATE
SPECIAL REQUESTS: ADEQUATE

## 2017-02-09 LAB — SURGICAL PCR SCREEN
MRSA, PCR: NEGATIVE
Staphylococcus aureus: NEGATIVE

## 2017-02-09 LAB — GLUCOSE, CAPILLARY
GLUCOSE-CAPILLARY: 105 mg/dL — AB (ref 65–99)
GLUCOSE-CAPILLARY: 102 mg/dL — AB (ref 65–99)
Glucose-Capillary: 102 mg/dL — ABNORMAL HIGH (ref 65–99)
Glucose-Capillary: 142 mg/dL — ABNORMAL HIGH (ref 65–99)

## 2017-02-09 LAB — ECHOCARDIOGRAM COMPLETE
HEIGHTINCHES: 63 in
Weight: 1996.49 oz

## 2017-02-09 SURGERY — ICD IMPLANT

## 2017-02-09 MED ORDER — SODIUM CHLORIDE 0.9 % IV SOLN
INTRAVENOUS | Status: DC
  Administered 2017-02-09: 12:00:00 via INTRAVENOUS

## 2017-02-09 MED ORDER — CEFAZOLIN SODIUM-DEXTROSE 1-4 GM/50ML-% IV SOLN: 1.0000 g | Freq: Four times a day (QID) | INTRAVENOUS | Status: DC

## 2017-02-09 MED ORDER — GENTAMICIN SULFATE 40 MG/ML IJ SOLN
80.0000 mg | INTRAMUSCULAR | Status: AC
  Administered 2017-02-09: 80 mg

## 2017-02-09 MED ORDER — CEFAZOLIN SODIUM-DEXTROSE 2-4 GM/100ML-% IV SOLN
2.0000 g | INTRAVENOUS | Status: AC
  Administered 2017-02-09: 2 g via INTRAVENOUS

## 2017-02-09 MED ORDER — FENTANYL CITRATE (PF) 100 MCG/2ML IJ SOLN
INTRAMUSCULAR | Status: AC
  Filled 2017-02-09: qty 2

## 2017-02-09 MED ORDER — IOPAMIDOL (ISOVUE-370) INJECTION 76%
INTRAVENOUS | Status: AC
  Filled 2017-02-09: qty 50

## 2017-02-09 MED ORDER — SODIUM CHLORIDE 0.9 % IR SOLN
Status: AC
  Filled 2017-02-09: qty 2

## 2017-02-09 MED ORDER — MIDAZOLAM HCL 5 MG/5ML IJ SOLN
INTRAMUSCULAR | Status: AC
  Filled 2017-02-09: qty 5

## 2017-02-09 MED ORDER — HEPARIN (PORCINE) IN NACL 2-0.9 UNIT/ML-% IJ SOLN
INTRAMUSCULAR | Status: AC
  Filled 2017-02-09: qty 500

## 2017-02-09 MED ORDER — BUPIVACAINE HCL (PF) 0.25 % IJ SOLN
INTRAMUSCULAR | Status: AC
  Filled 2017-02-09: qty 30

## 2017-02-09 MED ORDER — MIDAZOLAM HCL 5 MG/5ML IJ SOLN
INTRAMUSCULAR | Status: DC | PRN
  Administered 2017-02-09 (×4): 1 mg via INTRAVENOUS
  Administered 2017-02-09: 2 mg via INTRAVENOUS

## 2017-02-09 MED ORDER — ONDANSETRON HCL 4 MG/2ML IJ SOLN: 4.0000 mg | Freq: Four times a day (QID) | INTRAMUSCULAR | Status: DC | PRN

## 2017-02-09 MED ORDER — CHLORHEXIDINE GLUCONATE 4 % EX LIQD
60.0000 mL | Freq: Once | CUTANEOUS | Status: AC
  Administered 2017-02-09: 4 via TOPICAL
  Filled 2017-02-09: qty 15

## 2017-02-09 MED ORDER — BUPIVACAINE HCL (PF) 0.25 % IJ SOLN
INTRAMUSCULAR | Status: DC | PRN
  Administered 2017-02-09: 60 mL

## 2017-02-09 MED ORDER — SODIUM CHLORIDE 0.9 % IV SOLN: 250.0000 mL | INTRAVENOUS | Status: DC | PRN

## 2017-02-09 MED ORDER — CHLORHEXIDINE GLUCONATE 4 % EX LIQD: 60.0000 mL | Freq: Once | CUTANEOUS | Status: DC

## 2017-02-09 MED ORDER — FENTANYL CITRATE (PF) 100 MCG/2ML IJ SOLN
INTRAMUSCULAR | Status: DC | PRN
  Administered 2017-02-09 (×3): 12.5 ug via INTRAVENOUS

## 2017-02-09 MED ORDER — CEFAZOLIN SODIUM-DEXTROSE 2-4 GM/100ML-% IV SOLN
INTRAVENOUS | Status: AC
  Filled 2017-02-09: qty 100

## 2017-02-09 MED ORDER — IOPAMIDOL (ISOVUE-370) INJECTION 76%
INTRAVENOUS | Status: DC | PRN
  Administered 2017-02-09: 20 mL via INTRAVENOUS

## 2017-02-09 MED ORDER — SODIUM CHLORIDE 0.9% FLUSH
3.0000 mL | Freq: Two times a day (BID) | INTRAVENOUS | Status: DC
Start: 2017-02-09 — End: 2017-02-10
  Administered 2017-02-09: 3 mL via INTRAVENOUS

## 2017-02-09 MED ORDER — ACETAMINOPHEN 325 MG PO TABS: 325.0000 mg | ORAL_TABLET | ORAL | Status: DC | PRN

## 2017-02-09 MED ORDER — HEPARIN (PORCINE) IN NACL 2-0.9 UNIT/ML-% IJ SOLN
INTRAMUSCULAR | Status: AC | PRN
  Administered 2017-02-09: 500 mL

## 2017-02-09 MED ORDER — SODIUM CHLORIDE 0.9% FLUSH: 3.0000 mL | INTRAVENOUS | Status: DC | PRN

## 2017-02-09 MED ORDER — HYDROCODONE-ACETAMINOPHEN 5-325 MG PO TABS: 1.0000 | ORAL_TABLET | ORAL | Status: DC | PRN

## 2017-02-09 SURGICAL SUPPLY — 7 items
CABLE SURGICAL S-101-97-12 (CABLE) ×2 IMPLANT
ICD VISIA MRI VR DVFB1D4 (ICD Generator) IMPLANT
LEAD SPRINT QUAT SEC 6935M-62 (Lead) ×2 IMPLANT
PAD DEFIB LIFELINK (PAD) ×2 IMPLANT
SHEATH CLASSIC 9F (SHEATH) ×2 IMPLANT
TRAY PACEMAKER INSERTION (PACKS) ×2 IMPLANT
VISIA MRI VR DVFB1D4 (ICD Generator) ×3 IMPLANT

## 2017-02-09 NOTE — Progress Notes (Signed)
Occupational Therapy Treatment and Discharge Patient Details Name: Raven Martinez MRN: [id number] DOB: May 30, 1970 Today's Date: 02/09/2017    History of present illness 45 y.o. female s/p out of hospital VF arrest, subsequently underwent hypothermic protocol - left heart cath performed on 10/10 demonstrated normal coronaries, however LVEF 35% with global hypokinesis. Labs on admission significant for marked hypokalemia of 2.4 and hypocalcemia of 7.7, venous Lactate of 11, however, this was in the setting of prolonged down time. She was on no diuretics or medications that could be implicated at home. Fortunately, she has had neurologic recovery. 2D echo showed normal LVF with EF 60-65% with mild AR and MR.    OT comments  Pt is functioning at a set up to supervision level in ADL and ADL transfers. She has supervision at home for safety. No further OT needs.   Follow Up Recommendations  No OT follow up    Equipment Recommendations  None recommended by OT    Recommendations for Other Services      Precautions / Restrictions Restrictions Weight Bearing Restrictions: No       Mobility Bed Mobility               General bed mobility comments: pt received in chair  Transfers Overall transfer level: Modified independent               General transfer comment: from low chair    Balance     Sitting balance-Leahy Scale: Normal       Standing balance-Leahy Scale: Fair                             ADL either performed or assessed with clinical judgement   ADL Overall ADL's : Needs assistance/impaired     Grooming: Wash/dry hands;Standing;Supervision/safety   Upper Body Bathing: Set up;Sitting   Lower Body Bathing: Set up;Sitting/lateral leans   Upper Body Dressing : Set up;Sitting   Lower Body Dressing: Set up;Sit to/from stand   Toilet Transfer: Supervision/safety;Ambulation   Toileting- Clothing Manipulation and Hygiene: Modified  independent;Sit to/from stand   Tub/ Shower Transfer: Supervision/safety;Ambulation   Functional mobility during ADLs: Supervision/safety       Vision       Perception     Praxis      Cognition Arousal/Alertness: Awake/alert Behavior During Therapy: WFL for tasks assessed/performed Overall Cognitive Status: Within Functional Limits for tasks assessed                                          Exercises     Shoulder Instructions       General Comments      Pertinent Vitals/ Pain       Pain Assessment: No/denies pain  Home Living                                          Prior Functioning/Environment              Frequency  Min 2X/week        Progress Toward Goals  OT Goals(current goals can now be found in the care plan section)  Progress towards OT goals: Progressing toward goals  Acute Rehab OT Goals Patient Stated Goal: get better  Plan All goals met and education completed, patient discharged from OT services    Co-evaluation                 AM-PAC PT "6 Clicks" Daily Activity     Outcome Measure   Help from another person eating meals?: None Help from another person taking care of personal grooming?: A Little Help from another person toileting, which includes using toliet, bedpan, or urinal?: A Little Help from another person bathing (including washing, rinsing, drying)?: None Help from another person to put on and taking off regular upper body clothing?: None Help from another person to put on and taking off regular lower body clothing?: None 6 Click Score: 22    End of Session Equipment Utilized During Treatment: Gait belt  OT Visit Diagnosis: Unsteadiness on feet (R26.81)   Activity Tolerance Patient tolerated treatment well   Patient Left in chair;with family/visitor present   Nurse Communication          Time: 5329-9242 OT Time Calculation (min): 12 min  Charges: OT General  Charges $OT Visit: 1 Visit OT Treatments $Self Care/Home Management : 8-22 mins  02/09/2017 Raven Martinez, OTR/L Pager: Raven Martinez, Raven Martinez 02/09/2017, 12:01 PM

## 2017-02-09 NOTE — H&P (View-Only) (Signed)
Electrophysiology Rounding Note  Patient Name: Raven LaineShirley A Cosens Date of Encounter: 02/09/2017  Primary Cardiologist: Shirlee LatchMcLean Electrophysiologist: Graciela HusbandsKlein   Subjective   The patient is doing well today.  At this time, the patient denies chest pain, shortness of breath, or any new concerns.  Inpatient Medications    Scheduled Meds: . amLODipine  10 mg Oral Daily  . aspirin EC  81 mg Oral Daily  . carvedilol  3.125 mg Oral BID WC  . Chlorhexidine Gluconate Cloth  6 each Topical Daily  . folic acid  1 mg Oral Daily  . heparin  5,000 Units Subcutaneous Q8H  . thiamine  100 mg Oral Daily   Continuous Infusions: . sodium chloride Stopped (02/05/17 1900)  . sodium chloride    . ampicillin-sulbactam (UNASYN) IV 3 g (02/09/17 0356)   PRN Meds: acetaminophen, benzonatate, guaiFENesin-dextromethorphan, hydrALAZINE, oxyCODONE, sodium chloride flush   Vital Signs    Vitals:   02/08/17 2214 02/08/17 2352 02/09/17 0456 02/09/17 0845  BP: 110/87 105/84 109/76 139/87  Pulse: 94 85 82 98  Resp:      Temp:  99.5 F (37.5 C) 98.6 F (37 C)   TempSrc:  Oral Oral   SpO2:  97% 100% 100%  Weight:   103 lb 8 oz (46.9 kg)   Height:        Intake/Output Summary (Last 24 hours) at 02/09/17 0907 Last data filed at 02/09/17 0356  Gross per 24 hour  Intake              440 ml  Output                0 ml  Net              440 ml   Filed Weights   02/07/17 0516 02/08/17 0638 02/09/17 0456  Weight: 111 lb 3.2 oz (50.4 kg) 102 lb 11.2 oz (46.6 kg) 103 lb 8 oz (46.9 kg)    Physical Exam    GEN- The patient is well appearing, alert and oriented x 3 today.   Head- normocephalic, atraumatic Eyes-  Sclera clear, conjunctiva pink Ears- hearing intact Oropharynx- clear Neck- supple Lungs- Clear to ausculation bilaterally, normal work of breathing Heart- Regular rate and rhythm  GI- soft, NT, ND, + BS Extremities- no clubbing, cyanosis, or edema Skin- no rash or lesion Psych- euthymic  mood, full affect Neuro- strength and sensation are intact  Labs    CBC  Recent Labs  02/07/17 0333 02/08/17 0538  WBC 5.7 6.6  HGB 8.3* 9.9*  HCT 25.0* 30.2*  MCV 76.5* 75.5*  PLT 137* 193   Basic Metabolic Panel  Recent Labs  02/08/17 0538 02/08/17 1409  NA 138 138  K 5.3* 4.6  CL 97* 100*  CO2 29 28  GLUCOSE 88 130*  BUN 12 18  CREATININE 1.31* 1.34*  CALCIUM 9.9 10.1    Telemetry    Sinus rhythm, short run AT (personally reviewed)  Radiology    Mr Cardiac Morphology W Wo Contrast Result Date: 02/08/2017 EXAM: CARDIAC MRI TECHNIQUE: The patient was scanned on a 1.5 Tesla GE magnet. A dedicated cardiac coil was used. Functional imaging was done using Fiesta sequences. 2,3, and 4 chamber views were done to assess for RWMA's. Modified Simpson's rule using a short axis stack was used to calculate an ejection fraction on a dedicated work Research officer, trade unionstation using Circle software. The patient received 15 cc of Multihance. After 10 minutes inversion recovery sequences were  used to assess for infiltration and scar tissue. CONTRAST:  15 cc Multihance FINDINGS: Limited images of the lung fields showed airspace disease in the left lower lobe. Normal left ventricular size. EF 59%, normal wall motion. Severe asymmetric septal hypertrophy. LV outflow tract turbulence is suggestive of LV outflow gradient. Normal right ventricular size and systolic function. Mild left atrial enlargement. Normal right atrial size. Trileaflet aortic valve with mild aortic insufficiency. There appears to be mitral valve systolic anterior motion with posteriorly-directed, at least moderate mitral regurgitation. Delayed enhancement imaging showed subtle mid-wall late gadolinium enhancement (LGE) in the basal to mid anterior wall. 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 There is severe asymmetric septal hypertrophy with  mitral valve systolic anterior motion and at least moderate eccentric mitral regurgitation. Turbulence in the LV outflow tract suggests significant LVOT gradient. LGE pattern of subtle mid-wall fibrosis in the basal to mid anterior wall. Normal RV size and systolic function. Would reassess echo for MR and LVOT gradient. Dalton Mclean Electronically Signed   By: Marca Ancona M.D.   On: 02/08/2017 23:03     Patient Profile     Raven Martinez is a 46 y.o. female admitted with VF arrest. Cardiac MRI yesterday showed HOCM  Assessment & Plan    1.  OOH VF arrest Cardiac MRI showed HOCM  Plan for ICD later today for secondary prevention. Risks, benefits reviewed with patient who wishes to proceed Keep K>3.9, Mg>1.8 No driving x6 months Referral to genetic counselor placed for HOCM (she has 4 children, no family history of syncope or sudden death - dad died in his 77's of ?MI)  2.  HTN Stable No change required today  3.  Atrial tachycardia Identified on telemetry Asymptomatic Will plan for Visia AF device to track atrial arrhythmias  Signed, Gypsy Balsam, NP  02/09/2017, 9:07 AM    I have seen, examined the patient, and reviewed the above assessment and plan.  Changes to above are made where necessary.  On exam, RRR.  Given markedly abnormal cardiac MRI concerning for HOCM, I agree with Dr Graciela Husbands that ICD implantation for secondary prevention is advised.  Risks, benefits, alternatives to ICD implantation were discussed in detail with the patient today. The patient  understands that the risks include but are not limited to bleeding, infection, pneumothorax, perforation, tamponade, vascular damage, renal failure, MI, stroke, death, inappropriate shocks, and lead dislodgement and wishes to proceed.  We will therefore schedule device implantation at the next available time.  Will make NPO for today.  I also discussed referral to genetic counselor at discharge with the patient and her son  today.   Co Sign: [name], MD 02/09/2017 11:55 AM

## 2017-02-09 NOTE — Interval H&P Note (Signed)
History and Physical Interval Note:  02/09/2017 11:58 AM  Raven Martinez  has presented today for surgery, with the diagnosis of vf arrest  The various methods of treatment have been discussed with the patient and family. After consideration of risks, benefits and other options for treatment, the patient has consented to  Procedure(s): ICD IMPLANT (N/A) as a surgical intervention .  The patient's history has been reviewed, patient examined, no change in status, stable for surgery.  I have reviewed the patient's chart and labs.  Questions were answered to the patient's satisfaction.     ICD Criteria  Current LVEF:>55%. Within 12 months prior to implant: Yes   Heart failure history: No  Cardiomyopathy history: yes, hypertrophic CM  Atrial Fibrillation/Atrial Flutter: No.  Ventricular tachycardia history: Yes, Hemodynamic instability present. VT Type: Sustained Ventricular Tachycardia - Polymorphic.  Cardiac arrest history: Yes, Ventricular Fibrillation.  History of syndromes with risk of sudden death: Yes, Other hypertrophic obstructive cardiomyopathy  Previous ICD: No.  Current ICD indication: Secondary  PPM indication: No.   Class I or II Bradycardia indication present: No  Beta Blocker therapy for 3 or more months: Yes, prescribed.   Ace Inhibitor/ARB therapy for 3 or more months: No, medical reason.  Normal EF.  Not indicated    Raven Martinez

## 2017-02-09 NOTE — Discharge Instructions (Signed)
° ° °  Supplemental Discharge Instructions for  Pacemaker/Defibrillator Patients  Activity No heavy lifting or vigorous activity with your left/right arm for 6 to 8 weeks.  Do not raise your left/right arm above your head for one week.  Gradually raise your affected arm as drawn below.           __            02/13/17           02/14/17                 02/15/17              02/16/17  NO DRIVING for  6 months      WOUND CARE - Keep the wound area clean and dry.  Do not get this area wet for one week. No showers for one week; you may shower on   02/16/17  . - The tape/steri-strips on your wound will fall off; do not pull them off.  No bandage is needed on the site.  DO  NOT apply any creams, oils, or ointments to the wound area. - If you notice any drainage or discharge from the wound, any swelling or bruising at the site, or you develop a fever > 101? F after you are discharged home, call the office at once.  Special Instructions - You are still able to use cellular telephones; use the ear opposite the side where you have your pacemaker/defibrillator.  Avoid carrying your cellular phone near your device. - When traveling through airports, show security personnel your identification card to avoid being screened in the metal detectors.  Ask the security personnel to use the hand wand. - Avoid arc welding equipment, MRI testing (magnetic resonance imaging), TENS units (transcutaneous nerve stimulators).  Call the office for questions about other devices. - Avoid electrical appliances that are in poor condition or are not properly grounded. - Microwave ovens are safe to be near or to operate.  Additional information for defibrillator patients should your device go off: - If your device goes off ONCE and you feel fine afterward, notify the device clinic nurses. - If your device goes off ONCE and you do not feel well afterward, call 911. - If your device goes off TWICE, call 911. - If your device  goes off THREE times in one day, call 911.  DO NOT DRIVE YOURSELF OR A FAMILY MEMBER WITH A DEFIBRILLATOR TO THE HOSPITAL--CALL 911.

## 2017-02-09 NOTE — Progress Notes (Signed)
Electrophysiology Rounding Note  Patient Name: Raven Martinez Date of Encounter: 02/09/2017  Primary Cardiologist: Shirlee LatchMcLean Electrophysiologist: Graciela HusbandsKlein   Subjective   The patient is doing well today.  At this time, the patient denies chest pain, shortness of breath, or any new concerns.  Inpatient Medications    Scheduled Meds: . amLODipine  10 mg Oral Daily  . aspirin EC  81 mg Oral Daily  . carvedilol  3.125 mg Oral BID WC  . Chlorhexidine Gluconate Cloth  6 each Topical Daily  . folic acid  1 mg Oral Daily  . heparin  5,000 Units Subcutaneous Q8H  . thiamine  100 mg Oral Daily   Continuous Infusions: . sodium chloride Stopped (02/05/17 1900)  . sodium chloride    . ampicillin-sulbactam (UNASYN) IV 3 g (02/09/17 0356)   PRN Meds: acetaminophen, benzonatate, guaiFENesin-dextromethorphan, hydrALAZINE, oxyCODONE, sodium chloride flush   Vital Signs    Vitals:   02/08/17 2214 02/08/17 2352 02/09/17 0456 02/09/17 0845  BP: 110/87 105/84 109/76 139/87  Pulse: 94 85 82 98  Resp:      Temp:  99.5 F (37.5 C) 98.6 F (37 C)   TempSrc:  Oral Oral   SpO2:  97% 100% 100%  Weight:   103 lb 8 oz (46.9 kg)   Height:        Intake/Output Summary (Last 24 hours) at 02/09/17 0907 Last data filed at 02/09/17 0356  Gross per 24 hour  Intake              440 ml  Output                0 ml  Net              440 ml   Filed Weights   02/07/17 0516 02/08/17 0638 02/09/17 0456  Weight: 111 lb 3.2 oz (50.4 kg) 102 lb 11.2 oz (46.6 kg) 103 lb 8 oz (46.9 kg)    Physical Exam    GEN- The patient is well appearing, alert and oriented x 3 today.   Head- normocephalic, atraumatic Eyes-  Sclera clear, conjunctiva pink Ears- hearing intact Oropharynx- clear Neck- supple Lungs- Clear to ausculation bilaterally, normal work of breathing Heart- Regular rate and rhythm  GI- soft, NT, ND, + BS Extremities- no clubbing, cyanosis, or edema Skin- no rash or lesion Psych- euthymic  mood, full affect Neuro- strength and sensation are intact  Labs    CBC  Recent Labs  02/07/17 0333 02/08/17 0538  WBC 5.7 6.6  HGB 8.3* 9.9*  HCT 25.0* 30.2*  MCV 76.5* 75.5*  PLT 137* 193   Basic Metabolic Panel  Recent Labs  02/08/17 0538 02/08/17 1409  NA 138 138  K 5.3* 4.6  CL 97* 100*  CO2 29 28  GLUCOSE 88 130*  BUN 12 18  CREATININE 1.31* 1.34*  CALCIUM 9.9 10.1    Telemetry    Sinus rhythm, short run AT (personally reviewed)  Radiology    Mr Cardiac Morphology W Wo Contrast Result Date: 02/08/2017 EXAM: CARDIAC MRI TECHNIQUE: The patient was scanned on a 1.5 Tesla GE magnet. A dedicated cardiac coil was used. Functional imaging was done using Fiesta sequences. 2,3, and 4 chamber views were done to assess for RWMA's. Modified Simpson's rule using a short axis stack was used to calculate an ejection fraction on a dedicated work Research officer, trade unionstation using Circle software. The patient received 15 cc of Multihance. After 10 minutes inversion recovery sequences were  used to assess for infiltration and scar tissue. CONTRAST:  15 cc Multihance FINDINGS: Limited images of the lung fields showed airspace disease in the left lower lobe. Normal left ventricular size. EF 59%, normal wall motion. Severe asymmetric septal hypertrophy. LV outflow tract turbulence is suggestive of LV outflow gradient. Normal right ventricular size and systolic function. Mild left atrial enlargement. Normal right atrial size. Trileaflet aortic valve with mild aortic insufficiency. There appears to be mitral valve systolic anterior motion with posteriorly-directed, at least moderate mitral regurgitation. Delayed enhancement imaging showed subtle mid-wall late gadolinium enhancement (LGE) in the basal to mid anterior wall. 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 There is severe asymmetric septal hypertrophy with  mitral valve systolic anterior motion and at least moderate eccentric mitral regurgitation. Turbulence in the LV outflow tract suggests significant LVOT gradient. LGE pattern of subtle mid-wall fibrosis in the basal to mid anterior wall. Normal RV size and systolic function. Would reassess echo for MR and LVOT gradient. Dalton Mclean Electronically Signed   By: Marca Ancona M.D.   On: 02/08/2017 23:03     Patient Profile     Raven Martinez is a 46 y.o. female admitted with VF arrest. Cardiac MRI yesterday showed HOCM  Assessment & Plan    1.  OOH VF arrest Cardiac MRI showed HOCM  Plan for ICD later today for secondary prevention. Risks, benefits reviewed with patient who wishes to proceed Keep K>3.9, Mg>1.8 No driving x6 months Referral to genetic counselor placed for HOCM (she has 4 children, no family history of syncope or sudden death - dad died in his 77's of ?MI)  2.  HTN Stable No change required today  3.  Atrial tachycardia Identified on telemetry Asymptomatic Will plan for Visia AF device to track atrial arrhythmias  Signed, Gypsy Balsam, NP  02/09/2017, 9:07 AM    I have seen, examined the patient, and reviewed the above assessment and plan.  Changes to above are made where necessary.  On exam, RRR.  Given markedly abnormal cardiac MRI concerning for HOCM, I agree with Dr Graciela Husbands that ICD implantation for secondary prevention is advised.  Risks, benefits, alternatives to ICD implantation were discussed in detail with the patient today. The patient  understands that the risks include but are not limited to bleeding, infection, pneumothorax, perforation, tamponade, vascular damage, renal failure, MI, stroke, death, inappropriate shocks, and lead dislodgement and wishes to proceed.  We will therefore schedule device implantation at the next available time.  Will make NPO for today.  I also discussed referral to genetic counselor at discharge with the patient and her son  today.   Co Sign: [name], MD 02/09/2017 11:55 AM

## 2017-02-10 ENCOUNTER — Encounter (HOSPITAL_COMMUNITY): Payer: Self-pay | Admitting: Internal Medicine

## 2017-02-10 ENCOUNTER — Inpatient Hospital Stay (HOSPITAL_COMMUNITY): Payer: Medicaid Other

## 2017-02-10 LAB — BASIC METABOLIC PANEL
ANION GAP: 11 (ref 5–15)
BUN: 23 mg/dL — ABNORMAL HIGH (ref 6–20)
CALCIUM: 9.1 mg/dL (ref 8.9–10.3)
CHLORIDE: 102 mmol/L (ref 101–111)
CO2: 25 mmol/L (ref 22–32)
Creatinine, Ser: 1.18 mg/dL — ABNORMAL HIGH (ref 0.44–1.00)
GFR calc Af Amer: >60 mL/min (ref >60)
GFR calc non Af Amer: 54 mL/min — ABNORMAL LOW (ref >60)
GLUCOSE: 111 mg/dL — AB (ref 65–99)
POTASSIUM: 4 mmol/L (ref 3.5–5.1)
Sodium: 138 mmol/L (ref 135–145)

## 2017-02-10 LAB — GLUCOSE, CAPILLARY
GLUCOSE-CAPILLARY: 114 mg/dL — AB (ref 65–99)
Glucose-Capillary: 111 mg/dL — ABNORMAL HIGH (ref 65–99)

## 2017-02-10 LAB — ALDOSTERONE + RENIN ACTIVITY W/ RATIO
ALDO / PRA Ratio: UNDETERMINED
Aldosterone: <1 ng/dL (ref 0.0–30.0)
PRA LC/MS/MS: <0.167 ng/mL/hr — ABNORMAL LOW (ref 0.167–5.380)

## 2017-02-10 MED ORDER — FOLIC ACID 1 MG PO TABS: 1.0000 mg | ORAL_TABLET | Freq: Every day | ORAL | 1 refills | Status: DC

## 2017-02-10 MED ORDER — AMLODIPINE BESYLATE 10 MG PO TABS: 10.0000 mg | ORAL_TABLET | Freq: Every day | ORAL | 1 refills | Status: DC

## 2017-02-10 MED ORDER — NADOLOL 20 MG PO TABS: 20.0000 mg | ORAL_TABLET | Freq: Every day | ORAL | 1 refills | Status: DC

## 2017-02-10 NOTE — Discharge Summary (Signed)
ELECTROPHYSIOLOGY PROCEDURE DISCHARGE SUMMARY    Patient ID: Raven Martinez,  MRN: [id number], DOB/AGE: Apr 19, 1971 46 y.o.  Admit date: 02/01/2017 Discharge date: 02/10/2017  Primary Care Physician: Shirlee Latch Primary Cardiologist: Aeron Lheureux/Klein  Primary Discharge Diagnosis:  1.  VF arrest 2.  HOCM  Secondary Discharge Diagnoses: 1.  Prolonged QT 2.  Hypokalemia 3.  Acute on chronic diastolic heart failure 4.  HTN   No Known Allergies  Procedures This Admission: 1.  Cardiac catheterization on 02/02/17 by Dr Katrinka Blazing. This demonstrated severe HTN, normal coronary arteries 2.  Cardiac MRI on 02/08/17 demonstrating HOCM with severe asymmetric septal hypertrophy with SAM of mitral valve  3.  Implantation of MDT single chamber ICD on 02/09/17 by Dr Johney Frame. The patient received a MDT Visia AF device with 6935 RV lead.  DFT's were less than 28J.  There were no early apparent complications. 4.  CXR on 02/10/17 demonstrated no ptx s/p ICD implant.   Brief HPI/Hospital Course:  Raven Martinez is a 46 y.o. female with a past medical history for hypertension who was at home on the day of admission and had a syncopal event. EMS was called and she was in VF and required defibrillation.  She was transported to [facility] and intubated for airway protection and underwent cooling. Catheterization demonstrated no CAD. She made progress and recovered neurologically.  Cardiac MRI demonstrated HOCM.  She underwent ICD implant 02/10/16 with details as outlined above. She was monitored on telemetry overnight which demonstrated sinus rhythm.  CXR demonstrated no ptx post device implant. Left chest was without hematoma/ecchymosis.  She was examined by Dr Johney Frame and considered stable for discharge to home.  Because of discordance between MRI and echocardiogram, TEE has been recommended to be considered as an outpatient.  She will follow up with Dr Shirlee Latch in the AHF clinic for scheduling and further consideration.    She has been referred to the genetic counselor at [facility]. She has also been referred to [facility] to establish for primary care.    No driving x6 months post arrest.  Her children have been advised to undergo echocardiograms to rule out HOCM.   Physical Exam: Vitals:   02/09/17 1823 02/09/17 1900 02/10/17 0501 02/10/17 0832  BP: (!) 109/91 101/82 108/82 123/85  Pulse: 95 85 82 94  Resp:   18   Temp:   98.5 F (36.9 C)   TempSrc:   Oral   SpO2: 100% 100% 99%   Weight:   103 lb 9.6 oz (47 kg)   Height:        GEN- The patient is well appearing, alert and oriented x 3 today.   HEENT: normocephalic, atraumatic; sclera clear, conjunctiva pink; hearing intact; oropharynx clear; neck supple Lungs- Clear to ausculation bilaterally, normal work of breathing.  No wheezes, rales, rhonchi Heart- Regular rate and rhythm, 3/6 SEM GI- soft, non-tender, non-distended, bowel sounds present  Extremities- no clubbing, cyanosis, or edema  MS- no significant deformity or atrophy Skin- warm and dry, no rash or lesion, left chest without hematoma/ecchymosis  Psych- euthymic mood, full affect Neuro- strength and sensation are intact    Labs:   Lab Results  Component Value Date   WBC 6.6 02/08/2017   HGB 9.9 (L) 02/08/2017   HCT 30.2 (L) 02/08/2017   MCV 75.5 (L) 02/08/2017   PLT 193 02/08/2017     Recent Labs Lab 02/10/17 0307  NA 138  K 4.0  CL 102  CO2 25  BUN 23*  CREATININE 1.18*  CALCIUM 9.1  GLUCOSE 111*     Discharge Medications:  Current Discharge Medication List    START taking these medications   Details  amLODipine (NORVASC) 10 MG tablet Take 1 tablet (10 mg total) by mouth daily. Qty: 90 tablet, Refills: 1    folic acid (FOLVITE) 1 MG tablet Take 1 tablet (1 mg total) by mouth daily. Qty: 90 tablet, Refills: 1    nadolol (CORGARD) 20 MG tablet Take 1 tablet (20 mg total) by mouth daily. Qty: 90 tablet, Refills: 1      CONTINUE  these medications which have NOT CHANGED   Details  cyclobenzaprine (FLEXERIL) 10 MG tablet Take 1 tablet (10 mg total) by mouth 3 (three) times daily. Qty: 20 tablet, Refills: 0        Disposition: Pt is being discharged home today in good condition. Discharge Instructions    Diet - low sodium heart healthy    Complete by:  As directed    Increase activity slowly    Complete by:  As directed      Follow-up Information    [facility] [address] [facility] up on 02/23/2017.   Specialty:  Cardiology Why:  at [facility]11AM Contact information: [address], [address] [place] [id number] [phone]       Marily LenteSeiler, Amber K, NP Follow up on 03/30/2017.   Specialty:  Cardiology Why:  at 8:20AM Contact information: [address] [place] [address] [phone]        Hillis RangeAllred, Fredrica Capano, MD Follow up on 05/11/2017.   Specialty:  Cardiology Why:  at [facility]3PM  Contact information: [address] [place] [address] [phone]        Laurey MoraleMcLean, Dalton S, MD Follow up on 03/07/2017.   Specialty:  Cardiology Why:  at [facility]2PM  Contact information: [address] [place] [address] [phone]        [facility] Follow up on 02/25/2017.   Why:  at 11AM Contact information: [address] [place] [address] [phone]          Duration of Discharge Encounter: Greater than 30 minutes including physician time.  Signed, [name], NP 02/10/2017 11:25 AM  I have seen, examined the patient, and reviewed the above assessment and plan.  Changes to above are made where necessary.  On exam, RRR.  No device site issues.  Device interrogation is normal.  CXR reveals no ptx, stable lead.  DC to home with routine follow-up and wound care.  No driving x 6 months.  Importance of genetic testing discussed with the patient and her family.  Co Sign: Hillis RangeJames Cheo Selvey, MD 02/10/2017 3:44  PM

## 2017-02-10 NOTE — Care Management Note (Signed)
Case Management Note  Patient Details  Name: Avelina LaineShirley A Cureton MRN: [id number] Date of Birth: 09/01/1970  Subjective/Objective:  Pt presented as a transfer from ICU- Witnessed Cardiac Arrest. Intubated/Extubated 02-05-17. S/p ICD 02-09-17. Plan for home- [facility]- pt declined services and DME 3n1 and Cane.                   Action/Plan: [facility]- pt is aware that [facility] for [facility]. No further needs from CM at this time.  Expected Discharge Date:  02/10/17               Expected Discharge Plan:  Home/Self Care  In-House Referral:  NA  Discharge planning Services  CM Consult, Medication Assistance, Follow-up appt scheduled, [facility]  Post Acute Care Choice:  NA Choice offered to:  NA  DME Arranged:  Patient refused services DME Agency:  NA  HH Arranged:  Patient Refused HH HH Agency:  NA  Status of Service:  Completed, signed off  If discussed at Long Length of Stay Meetings, dates discussed:    Additional Comments:  Gala LewandowskyGraves-Bigelow, Yan Pankratz Kaye, RN 02/10/2017, 12:07 PM

## 2017-02-10 NOTE — Progress Notes (Signed)
Pt discharged home. PIV removed. AVS reviewed. ICD care instructions reviewed with pt. Pt to follow up in ICD clinic, and with PCP. Pt left unit via wheelchair accompanied by family. Pt to be transported home by family.

## 2017-02-23 ENCOUNTER — Ambulatory Visit (INDEPENDENT_AMBULATORY_CARE_PROVIDER_SITE_OTHER): Payer: Self-pay | Admitting: *Deleted

## 2017-02-23 DIAGNOSIS — I469 Cardiac arrest, cause unspecified: Secondary | ICD-10-CM

## 2017-02-23 LAB — CUP PACEART INCLINIC DEVICE CHECK
Battery Remaining Longevity: 137 mo
HIGH POWER IMPEDANCE MEASURED VALUE: 67 Ohm
Implantable Lead Implant Date: 20181017
Implantable Lead Location: 753860
Implantable Pulse Generator Implant Date: 20181017
Lead Channel Pacing Threshold Amplitude: 0.875 V
Lead Channel Pacing Threshold Pulse Width: 0.4 ms
Lead Channel Sensing Intrinsic Amplitude: 29.75 mV
Lead Channel Setting Pacing Pulse Width: 0.4 ms
Lead Channel Setting Sensing Sensitivity: 0.3 mV
MDC IDC MSMT BATTERY VOLTAGE: 3.13 V
MDC IDC MSMT LEADCHNL RV IMPEDANCE VALUE: 380 Ohm
MDC IDC MSMT LEADCHNL RV IMPEDANCE VALUE: 456 Ohm
MDC IDC MSMT LEADCHNL RV SENSING INTR AMPL: 30 mV
MDC IDC SESS DTM: 20181031115547
MDC IDC SET LEADCHNL RV PACING AMPLITUDE: 3.5 V
MDC IDC STAT BRADY RV PERCENT PACED: 0 %

## 2017-02-23 NOTE — Progress Notes (Signed)
Wound check appointment. Steri-strips removed. Wound without redness or edema. Incision edges approximated, wound well healed. Normal device function. Threshold, sensing, and impedances consistent with implant measurements. Device programmed at 3.5V for extra safety margin until 3 month visit. Histogram distribution appropriate for patient and level of activity. 1 NSVT x 8 beats. Patient educated about wound care, arm mobility, lifting restrictions, shock plan. ROV w/ AS 03/30/2017. 05/11/16 w/ JA.

## 2017-02-24 NOTE — Progress Notes (Signed)
Subjective:    Patient ID: Raven Martinez, female    DOB: 09/07/1970, 46 y.o.   MRN: [id number]  CC: Hospital follow up  HPI: Hospital follow up  Patient was admitted on 10/9 and discharged on 10/18. Patient presented in VF and required defibrillation. Patient was intubated and was in ICU, patient self extubated. Cardiac MRI on 02/08/17 showing HOCM with severe asymmetric septal hypertrophy with SAM of mitral valve. Single chamber ICD implanted on 02/09/17 by Dr. Johney FrameAllred. Cardiac catheterization on 02/02/17 demonstrated severe HTN with normal coronary arteries  Genetic testing 11/15 @ 1pm. No PMHx of murmur that patient is aware of. Patient states she was healthy before hospitalization and had no PMHx. No history of HOCM in family. No history of sudden death of unknown cause in family. No history of MI in anyone <50y.o. Patient does not know much about family history of father's side but believes Biological father had heart issues and COPD.   Cardio follow up on 11/12 Cardio follow up for defibrillator on 11/16  Patient today states she feels back to baseline. No concerns at this moment. Patient's sister states friend was visiting and noticed patient did not get up from her chair when he was leaving and knew something was wrong so called 9-1-1. Patient does not remember incident or hospitalization.   ROS: no palpitations, no chest pain, no dyspnea, no syncope, no presyncope, no fatigue   Foot pain  Patient describes new right foot pain since waking up from intubation in hospital. States foot is numb and hurts causing her to walk "crazy". Pain is described as "all over" pain that is not worse in heal, sole, or top of foot. No edema noted, no redness, or increased tenderness with palpation. Patient maintains normal ROM. Pain wakes patient up in her sleep. No imaging obtained in hospital. Pain is rated a 10/10. Pain occurs both at rest and while walking. Patient notes pain most at night while  asleep. Patient has tried Icy hot with no relief. Patient has no PMHx of Diabetes. Patient has not tried tylenol, motrin, or home flexeril for fear of using medications without physician approval given new HOCM diagnosis.   Tobacco abuse  Smoking status reviewed. Current every day smoker. Smokes 1 cigarette a day. Encouraged smoking cessation and encouraged calling smoking quit line.    Objective:  BP 132/74   Pulse 72   Temp 98.1 F (36.7 C) (Oral)   Wt 101 lb (45.8 kg)   LMP  (LMP Unknown)   SpO2 99%   BMI 17.89 kg/m  Vitals and nursing note reviewed  General: well nourished, in no acute distress Neck: supple, non-tender, without lymphadenopathy Cardiac: RRR, systolic murmur auscultated, increased with valsalva  Respiratory: clear to auscultation bilaterally, no increased work of breathing Abdomen: soft, nontender, nondistended, no masses or organomegaly. Bowel sounds present Extremities: no edema or cyanosis. Warm, well perfused. 2+ radial and PT pulses bilaterally. Sensation intact bilaterally throughout foot. No increased tenderness with palpation. Normal ROM.  Skin: warm and dry, no rashes noted  Assessment & Plan:    HOCM (hypertrophic obstructive cardiomyopathy) (HCC) Patient presents for hospital follow up with new diagnosis of HOCM. Patient with no concerns at this time. Patient reports no palpitations, chest pain, dyspnea, syncope/presyncope, or fatigue. States she is back at baseline. Systolic murmur auscultated and increased with valsalva. Follow up appoints for genetic testing and cardiology follow up are made and patient verbalized she plans on attending them.  -maintain follow up  with cardiology and genetic testing  -avoid vasodilators  -follow up as needed  Foot pain, right Patient today with unusual picture of foot pain. Possible fracture, however no increased pain with palpation and normal ROM. Possible restless leg given history of increased pain when sleeping,  however not bilateral and only in foot without pain in legs. Possible neuropathic pain, however less likely given that pain only occurs intermittently without trigger. Possible nerve damage, however no radiculopathy in legs and only in foot.  -will obtain foot Xray to rule out structural etiology -advised to take OTC tylenol as needed for pain -follow up as needed -strict return precautions given   Tobacco abuse Patient smoking 1 cigarette a day since discharge. Patient voiced possible interest in quitting.  -encouraged smoking cessation.  -informed patient of quit line -informed patient to follow up when she would like to obtain nicotine patches or gum    Encouraged patient to set up New Patient appointment to establish care and obtain all necessary documentation.   Return if symptoms worsen or fail to improve.   Raven Manis, DO, PGY-1

## 2017-02-25 ENCOUNTER — Encounter: Payer: Self-pay | Admitting: Family Medicine

## 2017-02-25 ENCOUNTER — Ambulatory Visit (INDEPENDENT_AMBULATORY_CARE_PROVIDER_SITE_OTHER): Payer: Self-pay | Admitting: Family Medicine

## 2017-02-25 VITALS — BP 132/74 | HR 72 | Temp 98.1°F | Wt 101.0 lb

## 2017-02-25 DIAGNOSIS — M25571 Pain in right ankle and joints of right foot: Secondary | ICD-10-CM

## 2017-02-25 DIAGNOSIS — I421 Obstructive hypertrophic cardiomyopathy: Secondary | ICD-10-CM

## 2017-02-25 DIAGNOSIS — M79671 Pain in right foot: Secondary | ICD-10-CM

## 2017-02-25 DIAGNOSIS — Z720 Tobacco use: Secondary | ICD-10-CM | POA: Insufficient documentation

## 2017-02-25 NOTE — Assessment & Plan Note (Signed)
Patient today with unusual picture of foot pain. Possible fracture, however no increased pain with palpation and normal ROM. Possible restless leg given history of increased pain when sleeping, however not bilateral and only in foot without pain in legs. Possible neuropathic pain, however less likely given that pain only occurs intermittently without trigger. Possible nerve damage, however no radiculopathy in legs and only in foot.  -will obtain foot Xray to rule out structural etiology -advised to take OTC tylenol as needed for pain -follow up as needed -strict return precautions given

## 2017-02-25 NOTE — Assessment & Plan Note (Signed)
Patient smoking 1 cigarette a day since discharge. Patient voiced possible interest in quitting.  -encouraged smoking cessation.  -informed patient of quit line -informed patient to follow up when she would like to obtain nicotine patches or gum

## 2017-02-25 NOTE — Patient Instructions (Signed)
Cardiomyopathy, Adult Cardiomyopathy is a long-term (chronic) disease of the heart muscle. The disease makes the heart muscle thick, weak, or stiff. As a result, the heart works harder to pump blood. Over time, cardiomyopathy can lead to heart failure. There are several types of cardiomyopathy:  Dilated cardiomyopathy. This type causes the ventricles to become weak and stretched out.  Hypertrophic cardiomyopathy. This type causes the heart muscle to thicken.  Restrictive cardiomyopathy. This type causes the heart muscle to become stiff.  Ischemic cardiomyopathy. This type involves narrowing arteries that cause the walls of the heart to get thinner.  Peripartum cardiomyopathy. This type occurs during pregnancy or shortly after pregnancy.  What are the causes? This condition may be caused by:  A Delynn that is passed down (inherited) from a family member.  A medical condition that damages the heart, such as: ? Diabetes. ? High blood pressure. ? Viral infection of the heart. ? Heart attack. ? Coronary heart disease.  Alcoholism.  Using illegal drugs or some prescription medicines.  Pregnancy.  Your body absorbing and storing too much iron (hemochromatosis).  Autoimmune diseases, connective tissue diseases, endocrine diseases, and muscle diseases.  Cancer treatments.  Buildup of proteins in your organs (amyloidosis), or inflammation in your organs (sarcoidosis).  Often, the cause is not known. What increases the risk? This condition is more likely to develop in people who:  Have a family history of cardiomyopathy or other heart problems.  Are overweight or obese.  Use illegal drugs.  Abuse alcohol.  Have a medical condition that damages the heart.  What are the signs or symptoms? Symptoms of this condition include:  Shortness of breath, especially during activity.  Fatigue.  An irregular heartbeat and heart  murmurs.  Dizziness.  Light-headedness.  Fainting.  Chest pain.  Coughing.  Swelling in the lower legs, ankles, feet, abdomen, and neck veins.  Often, people with this condition have no symptoms. How is this diagnosed? This condition is diagnosed based on:  Your symptoms and medical history.  A physical exam.  Tests.  Tests may include:  Blood tests.  Imaging studies of your heart, such as: ? X-rays. ? An echocardiogram. ? An MRI.  An electrocardiogram (ECG). This records your heart's electrical activity.  A test in which you wear a portable device (event monitor) to record your heart's electrical activity while you go about your day.  A stress test. This monitors your heart's activity while exercising.  Cardiac catheterization. This procedure checks the blood pressure and blood flow in your heart.  An angiogram. This is an injection of dye into your arteries before imaging studies are taken.  Heart tissue biopsy. This removes a sample of heart tissue for examination.  How is this treated?  Treatment for this condition depends on the type of cardiomyopathy you have and the severity of your symptoms. If you do not have symptoms, you may not need treatment. If you need treatment, it may include:  Lifestyle changes, such as: ? Eating a heart-healthy diet that includes plenty of fruits, vegetables, and whole grains, and cutting down on salt (sodium). ? Maintaining a healthy weight, and losing weight, if needed. ? Getting regular exercise. ? Quitting smoking, if you smoke. ? Avoiding alcohol. ? Medicine to:  Lower your blood pressure.  Slow down your heart rate.  Keep your heart beating in a steady rhythm.  Clear excess fluids from your body.  Prevent blood clots.  Balance minerals (electrolytes) in your body and get rid of extra  sodium in your body.  Reduce inflammation.  Strengthen your heartbeat. ? Surgery to:  Repair a defect.  Remove  thickened tissue.  Destroy tissues in the area of abnormal electrical activity (ablation).  Implant a device to treat serious heart rhythm problems (implantable cardioverter-defibrillator, or ICD), or a pacemaker.  Replace your heart (heart transplant) if all other treatments have failed (end stage).  Other treatments may include cardiac resynchronization therapy (CRT) or a left ventricular assist device (LVAD). Follow these instructions at home: Lifestyle  Eat a heart-healthy diet. Work with your health care provider or a registered dietitian to learn about healthy eating options.  Maintain a healthy weight.  Stay physically active. Ask your health care provider to suggest some activities that are good for you.  Do not use any products that contain nicotine or tobacco, such as cigarettes and e-cigarettes. If you need help quitting, ask your health care provider.  Limit alcohol intake to no more than one drink per day for nonpregnant women and no more than two drinks per day for men. One drink equals 12 oz of beer, 5 oz of wine, or 1 oz of hard liquor.  Try to get at least 7 hours of sleep each night.  Find healthy ways to manage stress. General instructions  Take over-the-counter and prescription medicines only as told by your health care provider. Some medicines can be dangerous for your heart.  Tell all health care providers, including your dentist, that you have cardiomyopathy. When you visit the dentist or have surgery, ask your health care provider if you need antibiotics before having dental care or before the surgery.  Ask your health care provider if you should wear a medical identification bracelet. This may be important if you have a pacemaker or a defibrillator.  Make sure you get all recommended vaccinations and an annual flu shot.  Work closely with your health care provider to manage any long-lasting (chronic) conditions.  Keep all follow-up visits as told by your  health care provider. This is important, even if you do not have any symptoms. Your health care provider may need to make sure your condition is not getting worse. How is this prevented? This condition cannot be prevented. Parents, siblings, and children of people with this condition may be at risk for the condition. It is a good idea for them to get screened for the condition because it is best when cardiomyopathy is found early. Screening is done with an ECG and echocardiogram. People who want to start a family may also want to meet with a genetic counselor to discuss the risk of having a child with cardiomyopathy. Contact a health care provider if:  Your symptoms get worse.  You have new symptoms. Get help right away if:  You have severe chest pain.  You have shortness of breath.  You cough up pink, bubbly material.  You have sudden sweating.  You feel nauseous and you vomit.  You suddenly become light-headed or dizzy.  You feel your heart beating very quickly.  It feels like your heart is skipping beats. These symptoms may represent a serious problem that is an emergency. Do not wait to see if the symptoms will go away. Get medical help right away. Call your local emergency services (911 in the [place] Do not drive yourself to the hospital. This information is not intended to replace advice given to you by your health care provider. Make sure you discuss any questions you have with your health care  provider. Document Released: 06/25/2004 Document Revised: 12/09/2015 Document Reviewed: 10/13/2015 Elsevier Interactive Patient Education  [employer]2018 [employer]   It was a pleasure meeting you today.   Today we discussed your hospitalization. You are doing well and have all your follow up appointments set up. Please make sure to follow up with both the genetic appointment and both cardio appointments.   For your foot pain I have ordered an xray of your foot. Please also use over the  counter tylenol as needed for pain. Please follow up if your foot pain gets increasingly worse, you have difficulty walking, or your notice swelling.   I would like you to make a new patient appointment and fill out the paperwork up front regarding new patient information.   Please follow up as needed or sooner if symptoms persist or worsen. Please call the clinic immediately if you have any concerns.   Our clinic's number is [phone]. Please call with questions or concerns.   Thank you,  Oralia ManisSherin Abeera Flannery, DO

## 2017-02-25 NOTE — Assessment & Plan Note (Signed)
Patient presents for hospital follow up with new diagnosis of HOCM. Patient with no concerns at this time. Patient reports no palpitations, chest pain, dyspnea, syncope/presyncope, or fatigue. States she is back at baseline. Systolic murmur auscultated and increased with valsalva. Follow up appoints for genetic testing and cardiology follow up are made and patient verbalized she plans on attending them.  -maintain follow up with cardiology and genetic testing  -avoid vasodilators  -follow up as needed

## 2017-02-27 ENCOUNTER — Ambulatory Visit (INDEPENDENT_AMBULATORY_CARE_PROVIDER_SITE_OTHER): Payer: Self-pay

## 2017-02-27 ENCOUNTER — Ambulatory Visit (HOSPITAL_COMMUNITY)
Admission: EM | Admit: 2017-02-27 | Discharge: 2017-02-27 | Disposition: A | Discharge status: Home / Self Care | Payer: Self-pay | Attending: Internal Medicine | Admitting: Internal Medicine

## 2017-02-27 ENCOUNTER — Encounter (HOSPITAL_COMMUNITY): Payer: Self-pay | Admitting: Emergency Medicine

## 2017-02-27 DIAGNOSIS — M722 Plantar fascial fibromatosis: Secondary | ICD-10-CM

## 2017-02-27 NOTE — ED Triage Notes (Signed)
Pt here for pain at the bottom of right foot onset 3 weeks.... Reports pain increases at night  Denies inj/trauma  A&O x4... NAD... Ambulatory

## 2017-02-27 NOTE — ED Provider Notes (Signed)
[facility]    CSN: [id number] Arrival date & time: 02/27/17  1156     History   Chief Complaint Chief Complaint  Patient presents with  . Foot Pain    HPI Raven Martinez is a 46 y.o. female.   46 year-old female, presenting today complaining of 3 weeks of right foot pain. States that she has had pain in the sole of the right foot from the base of the heel to just under her toes. Worsening pain with weight bearing and ambulation. She states she was seen by PCP and told to try tylenol. She states she has been doing this without relief. Denies injury to the foot. No history of similar symptoms    The history is provided by the patient.  Foot Pain  This is a new problem. The current episode started more than 1 week ago. The problem occurs constantly. The problem has not changed since onset.Pertinent negatives include no chest pain, no abdominal pain, no headaches and no shortness of breath. The symptoms are aggravated by walking. Nothing relieves the symptoms. She has tried acetaminophen for the symptoms. The treatment provided no relief.    History reviewed. No pertinent past medical history.  Patient Active Problem List   Diagnosis Date Noted  . HOCM (hypertrophic obstructive cardiomyopathy) (HCC) 02/25/2017  . Foot pain, right 02/25/2017  . Tobacco abuse 02/25/2017  . Acute systolic (congestive) heart failure (HCC) 02/05/2017  . QT prolongation 02/05/2017  . Ventricular fibrillation (HCC) 02/02/2017  . Cardiac arrest (HCC)   . Acute respiratory failure with hypoxia (HCC)   . Anoxic encephalopathy (HCC)   . HYPERTENSION, BENIGN 01/09/2007    Past Surgical History:  Procedure Laterality Date  . CESAREAN SECTION      OB History    No data available       Home Medications    Prior to Admission medications   Medication Sig Start Date End Date Taking? Authorizing Provider  amLODipine (NORVASC) 10 MG tablet Take 1 tablet (10 mg total) by mouth daily.  02/11/17  Yes Seiler, Amber K, NP  folic acid (FOLVITE) 1 MG tablet Take 1 tablet (1 mg total) by mouth daily. 02/11/17  Yes Seiler, Amber K, NP  nadolol (CORGARD) 20 MG tablet Take 1 tablet (20 mg total) by mouth daily. 02/10/17 02/10/18 Yes Seiler, Amber K, NP  cyclobenzaprine (FLEXERIL) 10 MG tablet Take 1 tablet (10 mg total) by mouth 3 (three) times daily. Patient not taking: Reported on 02/23/2017 01/15/17   Mardella Layman, MD    Family History History reviewed. No pertinent family history.  Social History Social History   Tobacco Use  . Smoking status: Current Every Day Smoker  . Smokeless tobacco: Current User  Substance Use Topics  . Alcohol use: Yes  . Drug use: Yes    Types: Marijuana     Allergies   Patient has no known allergies.   Review of Systems Review of Systems  Constitutional: Negative for chills and fever.  HENT: Negative for ear pain and sore throat.   Eyes: Negative for pain and visual disturbance.  Respiratory: Negative for cough and shortness of breath.   Cardiovascular: Negative for chest pain and palpitations.  Gastrointestinal: Negative for abdominal pain and vomiting.  Genitourinary: Negative for dysuria and hematuria.  Musculoskeletal: Positive for arthralgias. Negative for back pain.  Skin: Negative for color change and rash.  Neurological: Negative for seizures, syncope and headaches.  All other systems reviewed and are negative.  Physical Exam Triage Vital Signs ED Triage Vitals [02/27/17 1217]  Enc Vitals Group     BP 103/79     Pulse Rate 73     Resp 20     Temp 98.3 F (36.8 C)     Temp Source Oral     SpO2 100 %     Weight      Height      Head Circumference      Peak Flow      Pain Score 8     Pain Loc      Pain Edu?      Excl. in GC?    No data found.  Updated Vital Signs BP 103/79 (BP Location: Left Arm)   Pulse 73   Temp 98.3 F (36.8 C) (Oral)   Resp 20   LMP  (LMP Unknown)   SpO2 100%   Visual  Acuity Right Eye Distance:   Left Eye Distance:   Bilateral Distance:    Right Eye Near:   Left Eye Near:    Bilateral Near:     Physical Exam  Constitutional: She is oriented to person, place, and time. She appears well-developed and well-nourished. No distress.  HENT:  Head: Normocephalic.  Eyes: EOM are normal. Pupils are equal, round, and reactive to light.  Neck: Normal range of motion.  Cardiovascular: Normal rate.  Pulmonary/Chest: Effort normal and breath sounds normal.  Abdominal: Soft.  Musculoskeletal: Normal range of motion.       Right foot: There is tenderness.  Tenderness to palpation of the plantar aspect of the right foot along the plantar fascia. Good pedal pulse and cap refill. Nv intact  Neurological: She is alert and oriented to person, place, and time.  Skin: Skin is warm. She is not diaphoretic.  Psychiatric: She has a normal mood and affect.  Nursing note and vitals reviewed.    UC Treatments / Results  Labs (all labs ordered are listed, but only abnormal results are displayed) Labs Reviewed - No data to display  EKG  EKG Interpretation None       Radiology Dg Foot Complete Right  Result Date: 02/27/2017 CLINICAL DATA:  Pt here for pain at the bottom of right foot onset 3 weeks. Pain radiates throughout whole foot, constant sharp pain with constant aching. Reports pain increases at night. No known injury to area. Nondiabetic. EXAM: RIGHT FOOT COMPLETE - 3+ VIEW COMPARISON:  None. FINDINGS: There is no evidence of fracture or dislocation. There is no evidence of arthropathy or other focal bone abnormality. Soft tissues are unremarkable. IMPRESSION: Negative. Electronically Signed   By: Amie Portland M.D.   On: 02/27/2017 13:24    Procedures Procedures (including critical care time)  Medications Ordered in UC Medications - No data to display   Initial Impression / Assessment and Plan / UC Course  I have reviewed the triage vital signs and  the nursing notes.  Pertinent labs & imaging results that were available during my care of the patient were reviewed by me and considered in my medical decision making (see chart for details).     Right foot pain - no injury Normal XR of the foot Based on symptoms and location of pain, patient likely has plantar fasciitis - will treat with tylenol, last creatinine slightly elevated - will avoid NSAIDs, and shoe inserts Given ortho referral as needed   Final Clinical Impressions(s) / UC Diagnoses   Final diagnoses:  Plantar fasciitis of right foot  New Prescriptions This SmartLink is deprecated. Use AVSMEDLIST instead to display the medication list for a patient.   Controlled Substance Prescriptions [place] Controlled Substance Registry consulted? Not Applicable   Alecia LemmingBlue, Yitzchak Kothari C, [place]PA-C 02/27/17 1335

## 2017-03-07 ENCOUNTER — Encounter (HOSPITAL_COMMUNITY): Payer: Self-pay | Admitting: Cardiology

## 2017-03-07 ENCOUNTER — Ambulatory Visit (HOSPITAL_COMMUNITY)
Admit: 2017-03-07 | Discharge: 2017-03-07 | Disposition: A | Discharge status: Home / Self Care | Payer: Medicaid Other | Attending: Cardiology | Admitting: Cardiology

## 2017-03-07 VITALS — BP 112/89 | HR 72 | Wt 105.8 lb

## 2017-03-07 DIAGNOSIS — Z79899 Other long term (current) drug therapy: Secondary | ICD-10-CM | POA: Diagnosis not present

## 2017-03-07 DIAGNOSIS — I421 Obstructive hypertrophic cardiomyopathy: Secondary | ICD-10-CM | POA: Diagnosis not present

## 2017-03-07 DIAGNOSIS — Z8674 Personal history of sudden cardiac arrest: Secondary | ICD-10-CM | POA: Insufficient documentation

## 2017-03-07 DIAGNOSIS — I469 Cardiac arrest, cause unspecified: Secondary | ICD-10-CM

## 2017-03-07 DIAGNOSIS — F17200 Nicotine dependence, unspecified, uncomplicated: Secondary | ICD-10-CM | POA: Diagnosis not present

## 2017-03-07 DIAGNOSIS — I422 Other hypertrophic cardiomyopathy: Secondary | ICD-10-CM | POA: Diagnosis not present

## 2017-03-07 DIAGNOSIS — Z95810 Presence of automatic (implantable) cardiac defibrillator: Secondary | ICD-10-CM | POA: Insufficient documentation

## 2017-03-07 DIAGNOSIS — I10 Essential (primary) hypertension: Secondary | ICD-10-CM | POA: Insufficient documentation

## 2017-03-07 DIAGNOSIS — M722 Plantar fascial fibromatosis: Secondary | ICD-10-CM | POA: Insufficient documentation

## 2017-03-07 MED ORDER — AMLODIPINE BESYLATE 5 MG PO TABS: 5.0000 mg | ORAL_TABLET | Freq: Every day | ORAL | 3 refills | Status: DC

## 2017-03-07 MED ORDER — METOPROLOL SUCCINATE ER 50 MG PO TB24: 50.0000 mg | ORAL_TABLET | Freq: Two times a day (BID) | ORAL | 11 refills | Status: DC

## 2017-03-07 NOTE — Progress Notes (Signed)
PCP: Dr. Darin EngelsAbraham Cardiology: Dr. Shirlee LatchMcLean  46 yo with history of HTN and smoking was admitted in 10/18 after suffering out of hospital cardiac arrest.  Rhythm was VT and she was defibrillated. She had left heart cath showing no CAD.  Echo and cardiac MRI in 10/18 were consistent with hypertrophic cardiomyopathy. She had a Medtronic ICD placed.  She recovered well from the arrest and is now home.   She returns for followup of hypertrophic cardiomyopathy. She is mildly short of breath with fast walking.  No lightheadedness or syncope. No chest pain.  No problems walking at a slow and steady pace on flat ground.    ECG (personally reviewed): LVH with repolarization abnormality  Labs (10/18): K 4, creatinine 1.18  PMH: 1. HTN 2. Active smoker 3. Plantar fasciitis 4. Hypertrophic cardiomyopathy: Patient had cardiac arrest in 10/18 thought to be related to HOCM.  - LHC (10/18): Normal coronaries - Echo (10/18): EF 60-65%, moderate asymmetric septal hypertrophy, LVOT gradient to 159 mmHg peak, mild MR, mild AI.  - Cardiac MRI (10/18): EF 59%, severe asymmetric septal hypertrophy, mitral valve SAM with moderate MR, LGE in the mid wall basal to mid anterior wall, normal RV size and systolic function.  5. Cardiac arrest: VT in 10/18.  She now has a Medtronic ICD.    ROS: All systems reviewed and negative except as per HPI.   FH: Mother with heart murmur, father deceased (?cardiac arrest).   Social History   Socioeconomic History  . Marital status: Single    Spouse name: Not on file  . Number of children: Not on file  . Years of education: Not on file  . Highest education level: Not on file  Social Needs  . Financial resource strain: Not on file  . Food insecurity - worry: Not on file  . Food insecurity - inability: Not on file  . Transportation needs - medical: Not on file  . Transportation needs - non-medical: Not on file  Occupational History  . Not on file  Tobacco Use  . Smoking  status: Current Every Day Smoker  . Smokeless tobacco: Current User  Substance and Sexual Activity  . Alcohol use: Yes  . Drug use: Yes    Types: Marijuana  . Sexual activity: Not on file  Other Topics Concern  . Not on file  Social History Narrative  . Not on file   Current Outpatient Medications  Medication Sig Dispense Refill  . amLODipine (NORVASC) 5 MG tablet Take 1 tablet (5 mg total) daily by mouth. 30 tablet 3  . cyclobenzaprine (FLEXERIL) 10 MG tablet Take 1 tablet (10 mg total) by mouth 3 (three) times daily. 20 tablet 0  . folic acid (FOLVITE) 1 MG tablet Take 1 tablet (1 mg total) by mouth daily. 90 tablet 1  . metoprolol succinate (TOPROL XL) 50 MG 24 hr tablet Take 1 tablet (50 mg total) 2 (two) times daily by mouth. Take with or immediately following a meal. 60 tablet 11   No current facility-administered medications for this encounter.    BP 112/89   Pulse 72   Wt 105 lb 12.8 oz (48 kg)   LMP  (LMP Unknown)   SpO2 100%   BMI 18.74 kg/m  General: NAD Neck: No JVD, no thyromegaly or thyroid nodule.  Lungs: Clear to auscultation bilaterally with normal respiratory effort. CV: Nondisplaced PMI.  Heart regular S1/S2, no S3/S4, 3/6 HSM LLSB increased with valsalva.  No peripheral edema.  No carotid  bruit.  Normal pedal pulses.  Abdomen: Soft, nontender, no hepatosplenomegaly, no distention.  Skin: Intact without lesions or rashes.  Neurologic: Alert and oriented x 3.  Psych: Normal affect. Extremities: No clubbing or cyanosis.  HEENT: Normal.   Assessment/Plan: 1. Hypertrophic obstructive cardiomyopathy: Cardiac MRI in 10/18 showed severe asymmetric septal hypertrophy with mitral valve SAM and mid wall LGE in the anterior wall.  These findings are suggestive of hypertrophic cardiomyopathy.  Echo in 10/18 showed LVOT gradient peak 159 mmHg. Currently, she is doing well with minimal exertional dyspnea.  - I will have her stop nadolol and start Toprol XL 50 mg bid.  -  I will also have her decrease amlodipine to 5 mg daily.  Eventually will replace amlodipine with verapamil CR.  - Once beta blocker and non-dihydropyridine CCB have been titrated up as much as tolerated, will repeat echo to see if LVOT gradient is any lower.  If she is symptomatic, will need to consider septal myectomy.  - She will see a genetic counselor later this week to consider genetic testing for HCM.  If a mutation is found, family can be tested.  If mutation is not found or if she does not undergo genetic testing, her siblings and children should get echoes.   2. Cardiac arrest: VT.  Suspect this was related to HOCM.  She now has an ICD.   3. Smoking: I encouraged her to quit.   Marca AnconaDalton Hermenegildo Clausen 03/07/2017

## 2017-03-07 NOTE — Patient Instructions (Signed)
STOP Nadolol.  START Toprol XL 50mg  twice daily.  DECREASE Amlodipine to 5mg  daily.  Follow up with Dr.McLean in 3 weeks

## 2017-03-08 ENCOUNTER — Encounter (HOSPITAL_COMMUNITY): Payer: Self-pay | Admitting: Emergency Medicine

## 2017-03-08 ENCOUNTER — Ambulatory Visit (HOSPITAL_COMMUNITY)
Admission: EM | Admit: 2017-03-08 | Discharge: 2017-03-08 | Disposition: A | Discharge status: Home / Self Care | Payer: Self-pay | Attending: Emergency Medicine | Admitting: Emergency Medicine

## 2017-03-08 DIAGNOSIS — M722 Plantar fascial fibromatosis: Secondary | ICD-10-CM

## 2017-03-08 NOTE — ED Provider Notes (Signed)
[facility]    CSN: [id number] Arrival date & time: 03/08/17  1646     History   Chief Complaint Chief Complaint  Patient presents with  . Foot Pain    HPI Raven Martinez is a 46 y.o. female.   Pt is here for the pain to rt foot . Same pain from last visit on 11/4. Was told that we could not give any further medications due to her levels and heart condition. Pt denies any change in the foot pain she just wanted more meds for discomfort. She is not able to see ortho due to insurance issues. Pt did see her cardiology yesterday but was not given any meds for this.       History reviewed. No pertinent past medical history.  Patient Active Problem List   Diagnosis Date Noted  . HOCM (hypertrophic obstructive cardiomyopathy) (HCC) 02/25/2017  . Foot pain, right 02/25/2017  . Tobacco abuse 02/25/2017  . Acute systolic (congestive) heart failure (HCC) 02/05/2017  . QT prolongation 02/05/2017  . Ventricular fibrillation (HCC) 02/02/2017  . Cardiac arrest (HCC)   . Acute respiratory failure with hypoxia (HCC)   . Anoxic encephalopathy (HCC)   . HYPERTENSION, BENIGN 01/09/2007    Past Surgical History:  Procedure Laterality Date  . CESAREAN SECTION      OB History    No data available       Home Medications    Prior to Admission medications   Medication Sig Start Date End Date Taking? Authorizing Provider  amLODipine (NORVASC) 5 MG tablet Take 1 tablet (5 mg total) daily by mouth. 03/07/17  Yes Laurey MoraleMcLean, Dalton S, MD  cyclobenzaprine (FLEXERIL) 10 MG tablet Take 1 tablet (10 mg total) by mouth 3 (three) times daily. 01/15/17  Yes Hagler, Arlys JohnBrian, MD  folic acid (FOLVITE) 1 MG tablet Take 1 tablet (1 mg total) by mouth daily. 02/11/17  Yes Seiler, Amber K, NP  metoprolol succinate (TOPROL XL) 50 MG 24 hr tablet Take 1 tablet (50 mg total) 2 (two) times daily by mouth. Take with or immediately following a meal. 03/07/17 03/07/18 Yes Laurey MoraleMcLean, Dalton S, MD     Family History History reviewed. No pertinent family history.  Social History Social History   Tobacco Use  . Smoking status: Current Every Day Smoker  . Smokeless tobacco: Current User  Substance Use Topics  . Alcohol use: Yes  . Drug use: Yes    Types: Marijuana     Allergies   Patient has no known allergies.   Review of Systems Review of Systems  Constitutional: Negative.   Respiratory: Negative.   Cardiovascular: Negative.   Musculoskeletal:       Rt foot pain to planter side of foot . With walking   Skin: Negative.   Neurological: Negative.      Physical Exam Triage Vital Signs ED Triage Vitals  Enc Vitals Group     BP 03/08/17 1708 96/73     Pulse Rate 03/08/17 1708 78     Resp 03/08/17 1708 20     Temp 03/08/17 1708 97.9 F (36.6 C)     Temp Source 03/08/17 1708 Oral     SpO2 03/08/17 1708 100 %     Weight --      Height --      Head Circumference --      Peak Flow --      Pain Score 03/08/17 1711 10     Pain Loc --  Pain Edu? --      Excl. in GC? --    No data found.  Updated Vital Signs BP 96/73 (BP Location: Left Arm)   Pulse 78   Temp 97.9 F (36.6 C) (Oral)   Resp 20   LMP  (LMP Unknown)   SpO2 100%   Visual Acuity     Physical Exam  Constitutional: She appears well-developed.  Cardiovascular: Normal heart sounds.  Pulmonary/Chest: Effort normal.  Musculoskeletal: She exhibits tenderness.  Pain to planter side of rt foot with wt bearing, strong pulses ,warm to touch,   Neurological: She is alert.  Skin: Skin is warm.     UC Treatments / Results  Labs (all labs ordered are listed, but only abnormal results are displayed) Labs Reviewed - No data to display  EKG  EKG Interpretation None       Radiology No results found.  Procedures Procedures (including critical care time)  Medications Ordered in UC Medications - No data to display   Initial Impression / Assessment and Plan / UC Course  I have  reviewed the triage vital signs and the nursing notes.  Pertinent labs & imaging results that were available during my care of the patient were reviewed by me and considered in my medical decision making (see chart for details).     You currently have the brace we discussed you using a ball to foot and heat Due to your medical hx and kidney function we are not able to give anything further for pain  You would need to see ortho or follow up with your cardiology for further medications  Reviewed previous chart    Final Clinical Impressions(s) / UC Diagnoses   Final diagnoses:  Plantar fasciitis of right foot    ED Discharge Orders    None       Controlled Substance Prescriptions [place] Controlled Substance Registry consulted? Not Applicable   Coralyn MarkMitchell, Melanie L, NP 03/08/17 1732

## 2017-03-08 NOTE — Discharge Instructions (Signed)
You currently have the brace we discussed you using a ball to foot and heat Due to your medical hx and kidney function we are not able to give anything further for pain  You would need to see ortho or follow up with your cardiology for further medications

## 2017-03-08 NOTE — ED Triage Notes (Signed)
Pt here for persistent pain on the bottom of her right foot onset 3 weeks +++   Seen here on 02/27/17 for similar sx  Sts pain is getting worse  A&O x4... NAD... Ambulatory

## 2017-03-10 ENCOUNTER — Ambulatory Visit: Payer: Self-pay | Admitting: Genetic Counselor

## 2017-03-15 NOTE — Progress Notes (Signed)
Pre-test GC notes  Raven Martinez was referred for genetic testing of hypertrophic cardiomyopathy. She was counseled on the genetics of hypertrophic cardiomyopathy (HCM), namely inheritance, incomplete penetrance, variable expression and digenic/compound mutations that can be seen in some patients with HCM. We also briefly discussed the infiltrative cardiomyopathies, their inheritance pattern and treatment plans. We walked through the process of genetic testing. The potential outcomes of genetic testing and subsequent management of at-risk family members were discussed so as to manage expectations. Her medical and 5-generation family history was obtained. See details below  Personal Medical Information Raven Martinez (III.1 on pedigree), a 46 year old African American woman, is here today with her youngest sister, Raven Martinez for a genetic consult of HCM. She was diagnosed with severe asymmetric hypertrophy in October 2018. Raven Martinez works at the [employer]. She states that after working she went home and lay down. Later that evening her friend tried waking her up and found her unresponsive. 911 was called, she was revived and taken to the ER. Imaging studies performed at that time revealed cardiac wall thickness suggestive of HCM.  She informs me that she has uncontrolled hypertension for the last 15 years. HTN is a risk factor for HCM and typically presents as concentric hypertrophy. She denies having any symptoms of shortness of breath, chest pain or fainting. She does report one event of dizzy spell in the summer of 2018 that was thought to be due to dehydration. She now has an ICD implanted under her skin.  Family history Raven Martinez (III.1) is the eldest of three siblings. The middle sister (III.2) is currently in good health and her youngest sister (III.3) was told of having a murmur at [address] at her regular physical. She had a normal EKG but an echocardiogram was not performed. I informed them that an  Echocardiogram and EKG is recommended for all first-degree relatives (siblings and children) of a HCM patient. Raven Martinez states that she will get a cardiology screening soon. Raven Martinez has four kids ages 4623, 7521, 6220 and 3510 (IV.1-IV.4) and a grandson (V.1) age 69. She is very concerned about their risk of inheriting her condition. Her kids (IV.1-IV.4), and nieces and nephews (IV.5-IV.9) are in relatively good health.  Raven Martinez's dad  (II.2) died of cardiac arrest at age 47. She states that as he was walking back home from work, he suddenly was unable to breathe. 911 was called and he was taken to the ER where he died soon after. Raven Martinez states that an autopsy was not performed but that his death certificate says that he had pulseless electric activity and stated cardiac arrest as cause of death. They are not stay in touch with their dad's family and do not have any information about their health status.   Raven Martinez's mom (II.3) is now 471. She had two strokes and is now in kidney failure. She has 4 brothers (II.5, II.7-II.9) and a sister (II.6) who are in relatively good health. One of her sisters (II.4) died at age 47 from a likely pulmonary embolism.    Impression and Plans  In summary, Raven Martinez presents with the severe asymmetric hypertrophic cardiomyopathy.  Though she harbors a risk factor, namely hypertension, her disease presentation is unlike that of patients with uncontrolled hypertension. Such patients usually present with concentric hypertrophy. While her dad died suddenly of a cardiac arrest, there are no other family members with a diagnosis of HCM or that have died suddenly.   In light of her nature of presentation (sever  asymmetric hypertrophy) and early age of presentation, genetic testing for HCM is recommended. This test should include genes that encode components of the sarcomeric contractile apparatus and HCM phenocopies. Genetic testing will confirm the diagnosis and identify the genetic basis of  the disease. It will also help identify first-degree family members (siblings and children) that may harbor the mutation. As HCM is an autosomal dominant disorder, her children are at a 50% risk of inheriting her condition. Genetic testing her siblings and children for the familial pathogenic variant will help stratify risk for HCM in the family. Appropriate cardiology follow-up and lifestyle management can then be directed to genotype-positive family members. Raven Martinez does not have medical insurance at this point. Her sister is willing to help pay for her genetic test. They will schedule an appointment to fill out paperwork once they are ready to get tested.                                                                                                                                                                                                                                                                   Sidney AceSumy Brittney Caraway, Ph.D, [facility] Clinical Molecular Geneticist

## 2017-03-21 ENCOUNTER — Encounter (HOSPITAL_COMMUNITY): Payer: Self-pay | Admitting: *Deleted

## 2017-03-21 NOTE — Progress Notes (Signed)
Received medical records request from [facility] on 03/21/2017.  CASE # [id number]  Requested records faxed today to [phone].   Original request will be scanned to patient's electronic medical record.

## 2017-03-27 NOTE — Progress Notes (Signed)
Electrophysiology Office Note Date: 03/30/2017  ID:  Raven Martinez, DOB 06/04/1970, MRN [id number]  PCP: Oralia ManisAbraham, Sherin, DO Primary Cardiologist: Shirlee LatchMcLean Electrophysiologist: Allred  CC: Routine ICD follow-up  Raven Martinez is a 46 y.o. female seen today for Dr Johney FrameAllred.  She presents today for routine electrophysiology followup.  Since last being seen in our clinic, the patient reports doing reasonably well. She is tolerating medications without dizziness.  No chest pain or shortness of breath. She does have right leg pain and "cold" with "throbbing". Dr Shirlee LatchMcLean ordered arterial dopplers last week but these have not been done yet.  She denies chest pain, palpitations, dyspnea, PND, orthopnea, nausea, vomiting, dizziness, syncope, edema, weight gain, or early satiety.  She has not had ICD shocks.   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, HOCM, long QT History of appropriate therapy: No History of AAD therapy: No   No past medical history on file. Past Surgical History:  Procedure Laterality Date  . CESAREAN SECTION    . ICD IMPLANT N/A 02/09/2017   Procedure: ICD IMPLANT;  Surgeon: Hillis RangeAllred, James, MD;  [facility];  Service: Cardiovascular;  Laterality: N/A;  . LEFT HEART CATH AND CORONARY ANGIOGRAPHY N/A 02/02/2017   Procedure: LEFT HEART CATH AND CORONARY ANGIOGRAPHY;  Surgeon: Lyn RecordsSmith, Henry W, MD;  Location: [facility];  Service: Cardiovascular;  Laterality: N/A;    Current Outpatient Medications  Medication Sig Dispense Refill  . folic acid (FOLVITE) 1 MG tablet Take 1 tablet (1 mg total) by mouth daily. 90 tablet 1  . metoprolol succinate (TOPROL XL) 50 MG 24 hr tablet Take 1 tablet (50 mg total) 2 (two) times daily by mouth. Take with or immediately following a meal. 60 tablet 11  . verapamil (CALAN-SR) 180 MG CR tablet Take 1 tablet (180 mg total) by mouth at bedtime. 30 tablet 3   No current facility-administered  medications for this visit.     Allergies:   Patient has no known allergies.   Social History: Social History   Socioeconomic History  . Marital status: Single    Spouse name: Not on file  . Number of children: Not on file  . Years of education: Not on file  . Highest education level: Not on file  Social Needs  . Financial resource strain: Not on file  . Food insecurity - worry: Not on file  . Food insecurity - inability: Not on file  . Transportation needs - medical: Not on file  . Transportation needs - non-medical: Not on file  Occupational History  . Not on file  Tobacco Use  . Smoking status: Current Every Day Smoker  . Smokeless tobacco: Current User  Substance and Sexual Activity  . Alcohol use: Yes  . Drug use: Yes    Types: Marijuana  . Sexual activity: Not on file  Other Topics Concern  . Not on file  Social History Narrative  . Not on file    Family History: No family history on file.  Review of Systems: All other systems reviewed and are otherwise negative except as noted above.   Physical Exam: VS:  BP 122/88   Pulse 74   Ht 5\' 3"  (1.6 m)   Wt 102 lb (46.3 kg)   BMI 18.07 kg/m  , BMI Body mass index is 18.07 kg/m.  GEN- The patient is well appearing, alert and oriented x 3 today.   HEENT: normocephalic, atraumatic; sclera  clear, conjunctiva pink; hearing intact; oropharynx clear; neck supple Lungs- Clear to ausculation bilaterally, normal work of breathing.  No wheezes, rales, rhonchi Heart- Regular rate and rhythm, 2/6 SEM GI- soft, non-tender, non-distended, bowel sounds present Extremities- no clubbing, cyanosis, or edema; decreased right LE pulses MS- no significant deformity or atrophy Skin- warm and dry, no rash or lesion; ICD pocket well healed Psych- euthymic mood, full affect Neuro- strength and sensation are intact  ICD interrogation- reviewed in detail today,  See PACEART report  EKG:  EKG is not ordered today.  Recent  Labs: 02/01/2017: ALT 15 02/05/2017: Magnesium 1.9 02/08/2017: Hemoglobin 9.9; Platelets 193 02/10/2017: BUN 23; Creatinine, Ser 1.18; Potassium 4.0; Sodium 138   Wt Readings from Last 3 Encounters:  03/30/17 102 lb (46.3 kg)  03/28/17 104 lb 12.8 oz (47.5 kg)  03/07/17 105 lb 12.8 oz (48 kg)     Other studies Reviewed: Additional studies/ records that were reviewed today include: hospital records, AHF notes   Assessment and Plan:  1.  VF arrest/HOCM/Long QT Normal ICD function See Arita Missace Art report No changes today No driving x6 months from event (07/2017) Keep K>3.9, Mg >1.8  2.  HOCM Followed closely by AHF clinic She has seen genetic counselor  3.  Tobacco abuse Cessation advised   4.  Painful RLE Arterial dopplers ordered by Dr Shirlee LatchMcLean but not yet done [facility] her on today at 11AM    Current medicines are reviewed at length with the patient today.   The patient does not have concerns regarding her medicines.  The following changes were made today:  none  Labs/ tests ordered today include: Orders Placed This Encounter  Procedures  . CUP PACEART INCLINIC DEVICE CHECK     Disposition:   Follow up with Dr Johney FrameAllred as scheduled    Signed, Gypsy BalsamAmber Chiquitta Matty, NP 03/30/2017 8:49 AM  [facility] [address] [place] [address] [phone] (office) [phone] (fax)

## 2017-03-28 ENCOUNTER — Ambulatory Visit (HOSPITAL_COMMUNITY)
Admission: RE | Admit: 2017-03-28 | Discharge: 2017-03-28 | Disposition: A | Discharge status: Home / Self Care | Payer: Medicaid Other | Source: Ambulatory Visit | Attending: Cardiology | Admitting: Cardiology

## 2017-03-28 ENCOUNTER — Other Ambulatory Visit: Payer: Self-pay

## 2017-03-28 ENCOUNTER — Encounter (HOSPITAL_COMMUNITY): Payer: Self-pay | Admitting: Cardiology

## 2017-03-28 VITALS — BP 106/80 | HR 94 | Wt 104.8 lb

## 2017-03-28 DIAGNOSIS — I422 Other hypertrophic cardiomyopathy: Secondary | ICD-10-CM | POA: Diagnosis not present

## 2017-03-28 DIAGNOSIS — Z8674 Personal history of sudden cardiac arrest: Secondary | ICD-10-CM | POA: Diagnosis not present

## 2017-03-28 DIAGNOSIS — Z8249 Family history of ischemic heart disease and other diseases of the circulatory system: Secondary | ICD-10-CM | POA: Diagnosis not present

## 2017-03-28 DIAGNOSIS — Z79899 Other long term (current) drug therapy: Secondary | ICD-10-CM | POA: Diagnosis not present

## 2017-03-28 DIAGNOSIS — I739 Peripheral vascular disease, unspecified: Secondary | ICD-10-CM

## 2017-03-28 DIAGNOSIS — I10 Essential (primary) hypertension: Secondary | ICD-10-CM | POA: Insufficient documentation

## 2017-03-28 DIAGNOSIS — I469 Cardiac arrest, cause unspecified: Secondary | ICD-10-CM

## 2017-03-28 DIAGNOSIS — I421 Obstructive hypertrophic cardiomyopathy: Secondary | ICD-10-CM | POA: Diagnosis not present

## 2017-03-28 DIAGNOSIS — Z95810 Presence of automatic (implantable) cardiac defibrillator: Secondary | ICD-10-CM | POA: Diagnosis not present

## 2017-03-28 DIAGNOSIS — Z9889 Other specified postprocedural states: Secondary | ICD-10-CM | POA: Diagnosis not present

## 2017-03-28 DIAGNOSIS — F17200 Nicotine dependence, unspecified, uncomplicated: Secondary | ICD-10-CM | POA: Insufficient documentation

## 2017-03-28 MED ORDER — VERAPAMIL HCL ER 180 MG PO TBCR: 180.0000 mg | EXTENDED_RELEASE_TABLET | Freq: Every day | ORAL | 3 refills | Status: DC

## 2017-03-28 NOTE — Patient Instructions (Addendum)
Stop Amlodipine  Start Verapamil 180 mg (1 tab) at bedtime  Your physician has requested that you have a peripheral vascular angiogram. This exam is performed at the hospital. During this exam IV contrast is used to look at arterial blood flow. Please review the information sheet given for details.   Your physician recommends that you schedule a follow-up appointment in: 3 weeks with Dr. Shirlee LatchMcLean

## 2017-03-29 NOTE — Progress Notes (Signed)
PCP: Dr. Darin EngelsAbraham Cardiology: Dr. Shirlee LatchMcLean  46 yo with history of HTN and smoking was admitted in 10/18 after suffering out of hospital cardiac arrest.  Rhythm was VT and she was defibrillated. She had left heart cath showing no CAD.  Echo and cardiac MRI in 10/18 were consistent with hypertrophic cardiomyopathy. She had a Medtronic ICD placed.  She recovered well from the arrest and is now home.   She returns for followup of hypertrophic cardiomyopathy. She remains short of breath with moderate exertion such as cooking in the kitchen and showering.  She is generally fatigued.  Her right leg gets "cold."  She went to see Dr. Jomarie LongsJoseph to discuss genetic testing.  However, she has Medicaid and this will not cover genetic testing.    Labs (10/18): K 4, creatinine 1.18  PMH: 1. HTN 2. Active smoker 3. Plantar fasciitis 4. Hypertrophic cardiomyopathy: Patient had cardiac arrest in 10/18 thought to be related to HOCM.  - LHC (10/18): Normal coronaries - Echo (10/18): EF 60-65%, moderate asymmetric septal hypertrophy, LVOT gradient to 159 mmHg peak, mild MR, mild AI.  - Cardiac MRI (10/18): EF 59%, severe asymmetric septal hypertrophy, mitral valve SAM with moderate MR, LGE in the mid wall basal to mid anterior wall, normal RV size and systolic function.  5. Cardiac arrest: VT in 10/18.  She now has a Medtronic ICD.    ROS: All systems reviewed and negative except as per HPI.   FH: Mother with heart murmur, father deceased (?cardiac arrest).   Social History   Socioeconomic History  . Marital status: Single    Spouse name: Not on file  . Number of children: Not on file  . Years of education: Not on file  . Highest education level: Not on file  Social Needs  . Financial resource strain: Not on file  . Food insecurity - worry: Not on file  . Food insecurity - inability: Not on file  . Transportation needs - medical: Not on file  . Transportation needs - non-medical: Not on file  Occupational  History  . Not on file  Tobacco Use  . Smoking status: Current Every Day Smoker  . Smokeless tobacco: Current User  Substance and Sexual Activity  . Alcohol use: Yes  . Drug use: Yes    Types: Marijuana  . Sexual activity: Not on file  Other Topics Concern  . Not on file  Social History Narrative  . Not on file   Current Outpatient Medications  Medication Sig Dispense Refill  . folic acid (FOLVITE) 1 MG tablet Take 1 tablet (1 mg total) by mouth daily. 90 tablet 1  . metoprolol succinate (TOPROL XL) 50 MG 24 hr tablet Take 1 tablet (50 mg total) 2 (two) times daily by mouth. Take with or immediately following a meal. 60 tablet 11  . verapamil (CALAN-SR) 180 MG CR tablet Take 1 tablet (180 mg total) by mouth at bedtime. 30 tablet 3   No current facility-administered medications for this encounter.    BP 106/80   Pulse 94   Wt 104 lb 12.8 oz (47.5 kg)   SpO2 99%   BMI 18.56 kg/m  General: NAD Neck: No JVD, no thyromegaly or thyroid nodule.  Lungs: Clear to auscultation bilaterally with normal respiratory effort. CV: Nondisplaced PMI.  Heart regular S1/S2, no S3/S4, 3/6 SEM RUSB.  No peripheral edema.  No carotid bruit.  1+ PT pulse on left, unable to palpate PT pulse on right.   Abdomen:  Soft, nontender, no hepatosplenomegaly, no distention.  Skin: Intact without lesions or rashes.  Neurologic: Alert and oriented x 3.  Psych: Normal affect. Extremities: No clubbing or cyanosis.  HEENT: Normal.   Assessment/Plan: 1. Hypertrophic obstructive cardiomyopathy: Cardiac MRI in 10/18 showed severe asymmetric septal hypertrophy with mitral valve SAM and mid wall LGE in the anterior wall.  These findings are suggestive of hypertrophic cardiomyopathy.  Echo in 10/18 showed LVOT gradient peak 159 mmHg. She has NYHA class III symptoms with dyspnea and fatigue.  - Continue Toprol XL 50 mg bid.  - Stop amlodipine and start verapamil CR 180 mg daily.  - Once beta blocker and  non-dihydropyridine CCB have been titrated up as much as tolerated, will repeat echo to see if LVOT gradient is any lower.  If she remains significantly symptomatic, will need to consider septal myectomy.  - She saw genetic counselor but Medicaid will not cover testing.  Children and siblings will need to get echocardiograms done. 2. Cardiac arrest: VT.  Suspect this was related to HOCM.  She now has an ICD.   3. Smoking: I encouraged her to quit.  4. "Cold" right leg: She is a smoker, difficult to palpate right PT.  I will arrange for peripheral arterial doppler evaluation.   Marca AnconaDalton Eugen Jeansonne 03/29/2017

## 2017-03-30 ENCOUNTER — Other Ambulatory Visit (HOSPITAL_COMMUNITY): Payer: Self-pay | Admitting: Cardiology

## 2017-03-30 ENCOUNTER — Ambulatory Visit (INDEPENDENT_AMBULATORY_CARE_PROVIDER_SITE_OTHER): Payer: Medicaid Other | Admitting: Nurse Practitioner

## 2017-03-30 ENCOUNTER — Ambulatory Visit (HOSPITAL_COMMUNITY)
Admission: RE | Admit: 2017-03-30 | Discharge: 2017-03-30 | Disposition: A | Discharge status: Home / Self Care | Payer: Medicaid Other | Source: Ambulatory Visit | Attending: Cardiovascular Disease | Admitting: Cardiovascular Disease

## 2017-03-30 VITALS — BP 122/88 | HR 74 | Ht 63.0 in | Wt 102.0 lb

## 2017-03-30 DIAGNOSIS — Z720 Tobacco use: Secondary | ICD-10-CM | POA: Diagnosis not present

## 2017-03-30 DIAGNOSIS — I469 Cardiac arrest, cause unspecified: Secondary | ICD-10-CM | POA: Diagnosis not present

## 2017-03-30 DIAGNOSIS — I70203 Unspecified atherosclerosis of native arteries of extremities, bilateral legs: Secondary | ICD-10-CM | POA: Insufficient documentation

## 2017-03-30 DIAGNOSIS — I252 Old myocardial infarction: Secondary | ICD-10-CM | POA: Diagnosis not present

## 2017-03-30 DIAGNOSIS — I421 Obstructive hypertrophic cardiomyopathy: Secondary | ICD-10-CM

## 2017-03-30 DIAGNOSIS — R9389 Abnormal findings on diagnostic imaging of other specified body structures: Secondary | ICD-10-CM | POA: Insufficient documentation

## 2017-03-30 DIAGNOSIS — I10 Essential (primary) hypertension: Secondary | ICD-10-CM | POA: Insufficient documentation

## 2017-03-30 DIAGNOSIS — I739 Peripheral vascular disease, unspecified: Secondary | ICD-10-CM

## 2017-03-30 LAB — CUP PACEART INCLINIC DEVICE CHECK
Date Time Interrogation Session: 20181205083629
Implantable Lead Implant Date: 20181017
MDC IDC LEAD LOCATION: 753860
MDC IDC PG IMPLANT DT: 20181017

## 2017-03-30 NOTE — Patient Instructions (Signed)
Medication Instructions:    Your physician recommends that you continue on your current medications as directed. Please refer to the Current Medication list given to you today.   If you need a refill on your cardiac medications before your next appointment, please call your pharmacy.  Labwork: NONE ORDERED  TODAY    Testing/Procedures: NONE ORDERED  TODAY     Follow-Up:   WITH DR Johney FrameALLRED AS SCHEDULED    Any Other Special Instructions Will Be Listed Below (If Applicable).

## 2017-03-31 ENCOUNTER — Telehealth: Payer: Self-pay | Admitting: Nurse Practitioner

## 2017-03-31 NOTE — Telephone Encounter (Signed)
Patient calling, patient is in a lot of pain and would like to know if her ultrasound results are available?

## 2017-03-31 NOTE — Telephone Encounter (Signed)
Message routed to Dr.McLeans RN Katrina.

## 2017-04-01 NOTE — Telephone Encounter (Signed)
Patient called again with concerns of increased leg pain. Reports u/s was done 12/5 and would like to know if results are available or if she could take something else for the pain.\  Advised results are pending Dr Kathlyn SacramentoMcLeans review and I could only advise her to take OTC tylenol or Motrin for pain anything further would need to come from provider.  U/s results are not available as of 12/7, will follow up with imaging location to help expedite.   Message sent to provider

## 2017-04-01 NOTE — Telephone Encounter (Signed)
Do not have results of test back yet.  Will let her know when results available, hopefully soon.

## 2017-04-04 ENCOUNTER — Telehealth (HOSPITAL_COMMUNITY): Payer: Self-pay

## 2017-04-04 DIAGNOSIS — I739 Peripheral vascular disease, unspecified: Secondary | ICD-10-CM

## 2017-04-04 NOTE — Telephone Encounter (Signed)
Notes recorded by Teresa CoombsGunter, Koya Hunger, RN on 04/04/2017 at 12:19 PM EST Pt aware of results orders placed ------  Notes recorded by Laurey MoraleMcLean, Dalton S, MD on 04/03/2017 at 10:54 AM EST Bilateral PAD, [facility] with Allyson SabalBerry or Kirke CorinArida

## 2017-04-07 ENCOUNTER — Other Ambulatory Visit: Payer: Self-pay

## 2017-04-07 ENCOUNTER — Telehealth (HOSPITAL_COMMUNITY): Payer: Self-pay

## 2017-04-07 ENCOUNTER — Emergency Department (HOSPITAL_BASED_OUTPATIENT_CLINIC_OR_DEPARTMENT_OTHER): Admit: 2017-04-07 | Discharge: 2017-04-07 | Disposition: A | Discharge status: Home / Self Care | Payer: Medicaid Other

## 2017-04-07 ENCOUNTER — Emergency Department (HOSPITAL_COMMUNITY)
Admission: EM | Admit: 2017-04-07 | Discharge: 2017-04-07 | Disposition: A | Discharge status: Home / Self Care | Payer: Medicaid Other | Attending: Emergency Medicine | Admitting: Emergency Medicine

## 2017-04-07 ENCOUNTER — Encounter (HOSPITAL_COMMUNITY): Payer: Self-pay | Admitting: Emergency Medicine

## 2017-04-07 DIAGNOSIS — M79671 Pain in right foot: Secondary | ICD-10-CM

## 2017-04-07 DIAGNOSIS — I739 Peripheral vascular disease, unspecified: Secondary | ICD-10-CM

## 2017-04-07 DIAGNOSIS — Z79899 Other long term (current) drug therapy: Secondary | ICD-10-CM | POA: Insufficient documentation

## 2017-04-07 DIAGNOSIS — M79609 Pain in unspecified limb: Secondary | ICD-10-CM | POA: Diagnosis not present

## 2017-04-07 DIAGNOSIS — I10 Essential (primary) hypertension: Secondary | ICD-10-CM | POA: Insufficient documentation

## 2017-04-07 DIAGNOSIS — F17200 Nicotine dependence, unspecified, uncomplicated: Secondary | ICD-10-CM | POA: Insufficient documentation

## 2017-04-07 DIAGNOSIS — M722 Plantar fascial fibromatosis: Secondary | ICD-10-CM | POA: Diagnosis not present

## 2017-04-07 HISTORY — DX: Essential (primary) hypertension: I10

## 2017-04-07 MED ORDER — OXYCODONE-ACETAMINOPHEN 5-325 MG PO TABS
1.0000 | ORAL_TABLET | Freq: Once | ORAL | Status: AC
  Administered 2017-04-07: 1 via ORAL
  Filled 2017-04-07: qty 1

## 2017-04-07 MED ORDER — HYDROCODONE-ACETAMINOPHEN 5-325 MG PO TABS: 1.0000 | ORAL_TABLET | Freq: Four times a day (QID) | ORAL | 0 refills | Status: DC | PRN

## 2017-04-07 NOTE — ED Triage Notes (Signed)
Pt states she has been having right foot pain since October. Pt was told last week she has a DVT in right upper leg. Pt has not been started on blood thinners. Pt is ambulatory. Denies swelling to foot/leg. Denies CP/SOB.

## 2017-04-07 NOTE — Discharge Instructions (Signed)
Please follow-up with your cardiologist and scheduled appointment at the foot doctor for further evaluation and treatment of your foot pain.  For severe pain, especially at night, take 1-2 tablets of Norco.  You can take this every 4-6 hours as needed for severe pain.  Alternate with ibuprofen.  Do not drink alcohol, drive, operate machinery or participate in any other potentially dangerous activities while taking opiate pain medication as it may make you sleepy. Do not take this medication with any other sedating medications, either prescription or over-the-counter. If you were prescribed Percocet or Vicodin, do not take these with acetaminophen (Tylenol) as it is already contained within these medications and overdose of Tylenol is dangerous.   This medication is an opiate (or narcotic) pain medication and can be habit forming.  Use it as little as possible to achieve adequate pain control.  Do not use or use it with extreme caution if you have a history of opiate abuse or dependence. This medication is intended for your use only - do not give any to anyone else and keep it in a secure place where nobody else, especially children, have access to it. It will also cause or worsen constipation, so you may want to consider taking an over-the-counter stool softener while you are taking this medication.  Please return to the emergency department if you develop any new or worsening symptoms.

## 2017-04-07 NOTE — Telephone Encounter (Signed)
Needs PV appt asap with Kirke CorinArida or Allyson SabalBerry as mentioned in my note attached to the ABI study.

## 2017-04-07 NOTE — ED Provider Notes (Signed)
[facility] EMERGENCY [facility]MENT Provider Note   CSN: [id number] Arrival date & time: 04/07/17  1124     History   Chief Complaint Chief Complaint  Patient presents with  . Foot Pain    HPI Raven Martinez is a 46 y.o. female with history of hypertension, CHF, hypertrophic obstructive cardiomyopathy, ventricular fibrillation with ICD who presents with a 7136-month history of ongoing right foot pain.  Patient has been evaluated in the ED in the past and diagnosed with plantar fasciitis.  She also recently had vascular arterial ultrasound done which found some bilateral arterial obstruction involving iliac segment in the bilateral legs.  Patient also reports that the ultrasound tech told her she had a blood clot.  It is unclear whether this was meant to be the same thing as the above results, or something different.  Patient has not been it was talked to her doctor yet about the results, although she called a week ago.  She has been taking Tylenol for the pain in her foot, wearing a brace, massaging with a ball without relief.  She denies any fevers, chest pain, shortness of breath, abdominal pain, nausea, vomiting.  Patient has an appointment in 5 days with the foot doctor.  HPI  Past Medical History:  Diagnosis Date  . Hypertension     Patient Active Problem List   Diagnosis Date Noted  . HOCM (hypertrophic obstructive cardiomyopathy) (HCC) 02/25/2017  . Foot pain, right 02/25/2017  . Tobacco abuse 02/25/2017  . Acute systolic (congestive) heart failure (HCC) 02/05/2017  . QT prolongation 02/05/2017  . Ventricular fibrillation (HCC) 02/02/2017  . Cardiac arrest (HCC)   . Acute respiratory failure with hypoxia (HCC)   . Anoxic encephalopathy (HCC)   . HYPERTENSION, BENIGN 01/09/2007    Past Surgical History:  Procedure Laterality Date  . CESAREAN SECTION    . ICD IMPLANT N/A 02/09/2017   Procedure: ICD IMPLANT;  Surgeon: Hillis RangeAllred, James, MD;  Location: [facility]  INVASIVE CV LAB;  Service: Cardiovascular;  Laterality: N/A;  . LEFT HEART CATH AND CORONARY ANGIOGRAPHY N/A 02/02/2017   Procedure: LEFT HEART CATH AND CORONARY ANGIOGRAPHY;  Surgeon: Lyn RecordsSmith, Henry W, MD;  Location: [facility] INVASIVE CV LAB;  Service: Cardiovascular;  Laterality: N/A;    OB History    No data available       Home Medications    Prior to Admission medications   Medication Sig Start Date End Date Taking? Authorizing Provider  folic acid (FOLVITE) 1 MG tablet Take 1 tablet (1 mg total) by mouth daily. 02/11/17   Gypsy BalsamSeiler, Amber K, NP  HYDROcodone-acetaminophen (NORCO/VICODIN) 5-325 MG tablet Take 1-2 tablets by mouth every 6 (six) hours as needed for severe pain. 04/07/17   Enya Bureau, Waylan BogaAlexandra M, PA-C  metoprolol succinate (TOPROL XL) 50 MG 24 hr tablet Take 1 tablet (50 mg total) 2 (two) times daily by mouth. Take with or immediately following a meal. 03/07/17 03/07/18  Laurey MoraleMcLean, Dalton S, MD  verapamil (CALAN-SR) 180 MG CR tablet Take 1 tablet (180 mg total) by mouth at bedtime. 03/28/17   Laurey MoraleMcLean, Dalton S, MD    Family History History reviewed. No pertinent family history.  Social History Social History   Tobacco Use  . Smoking status: Current Every Day Smoker  . Smokeless tobacco: Current User  Substance Use Topics  . Alcohol use: Yes  . Drug use: Yes    Types: Marijuana     Allergies   Patient has no known allergies.  Review of Systems Review of Systems  Constitutional: Negative for chills and fever.  HENT: Negative for facial swelling and sore throat.   Respiratory: Negative for shortness of breath.   Cardiovascular: Negative for chest pain.  Gastrointestinal: Negative for abdominal pain, nausea and vomiting.  Genitourinary: Negative for dysuria.  Musculoskeletal: Positive for arthralgias (R foot). Negative for back pain.  Skin: Negative for rash and wound.  Neurological: Negative for headaches.  Psychiatric/Behavioral: The patient is not nervous/anxious.       Physical Exam Updated Vital Signs BP 108/76 (BP Location: Right Arm)   Pulse 66   Temp 97.9 F (36.6 C) (Oral)   Resp 16   LMP 03/10/2017   SpO2 100%   Physical Exam  Constitutional: She appears well-developed and well-nourished. No distress.  HENT:  Head: Normocephalic and atraumatic.  Mouth/Throat: Oropharynx is clear and moist. No oropharyngeal exudate.  Eyes: Conjunctivae are normal. Pupils are equal, round, and reactive to light. Right eye exhibits no discharge. Left eye exhibits no discharge. No scleral icterus.  Neck: Normal range of motion. Neck supple. No thyromegaly present.  Cardiovascular: Normal rate, regular rhythm, normal heart sounds and intact distal pulses. Exam reveals no gallop and no friction rub.  No murmur heard. Pulmonary/Chest: Effort normal and breath sounds normal. No stridor. No respiratory distress. She has no wheezes. She has no rales.  Abdominal: Soft. Bowel sounds are normal. She exhibits no distension. There is no tenderness. There is no rebound and no guarding.  Musculoskeletal: She exhibits no edema.  Generalized tenderness to the right foot, more so on the heel and plantar fascia, however some minor tenderness on the dorsal aspect No calf or leg tenderness  Lymphadenopathy:    She has no cervical adenopathy.  Neurological: She is alert. Coordination normal.  Skin: Skin is warm and dry. No rash noted. She is not diaphoretic. No pallor.  Psychiatric: She has a normal mood and affect.  Nursing note and vitals reviewed.    ED Treatments / Results  Labs (all labs ordered are listed, but only abnormal results are displayed) Labs Reviewed - No data to display  EKG  EKG Interpretation None       Radiology No results found.  Procedures Procedures (including critical care time)  Medications Ordered in ED Medications  oxyCODONE-acetaminophen (PERCOCET/ROXICET) 5-325 MG per tablet 1 tablet (1 tablet Oral Given 04/07/17 1524)      Initial Impression / Assessment and Plan / ED Course  I have reviewed the triage vital signs and the nursing notes.  Pertinent labs & imaging results that were available during my care of the patient were reviewed by me and considered in my medical decision making (see chart for details).     Patient with new diagnosis of peripheral vascular disease.  ABIs found to be 0.82 on the right and 0.87 on the left on 03/30/2017.  Pulses are faintly palpated today and confirmed with Doppler.  Suspect patient's foot pain more related to plantar fasciitis.  Patient has been doing everything at home that was recommended to her without any relief.  She has been taking ibuprofen and Tylenol without relief as well.  We will give patient Norco for sleeping, she has not been sleeping due to the pain.  Patient has an appointment with podiatry.  Advised follow-up to a cardiologist to order the ABIs for further management, no emergent indication for consult to vascular surgery today.  Patient confirmed not to have DVT with ultrasound today.  Patient understands  and agrees with plan.  Patient vitals stable throughout ED course and discharged in satisfactory condition.  Final Clinical Impressions(s) / ED Diagnoses   Final diagnoses:  Foot pain, right  Plantar fasciitis of right foot  PVD (peripheral vascular disease) with claudication [facility](HCC)    ED Discharge Orders        Ordered    HYDROcodone-acetaminophen (NORCO/VICODIN) 5-325 MG tablet  Every 6 hours PRN     04/07/17 1611       Emi HolesLaw, Shaquina Gillham M, PA-C 04/07/17 1706    Lavera GuiseLiu, Dana Duo, MD 04/08/17 [id number]

## 2017-04-07 NOTE — Telephone Encounter (Signed)
Advanced Heart Failure Triage Encounter  Patient Name: Avelina LaineShirley A Dempsey  Date of Call: 04/07/17  Problem:  Pt called was seen in ED 04/07/17 and was told she has blood clots and PVD. She was instructed to call Dr. Shirlee LatchMcLean. Pt has appt. 12/31. Pt needs referral for PVD?   Plan:    Teresa CoombsKatrina Normagene Harvie, RN

## 2017-04-07 NOTE — Progress Notes (Signed)
Preliminary results by tech - Right lower extremity venous duplex completed. No evidence of DVT or SVT.   Raven Martinez. [name]

## 2017-04-08 NOTE — Telephone Encounter (Signed)
Spoke with pt on the 10 th and referral placed

## 2017-04-12 NOTE — Telephone Encounter (Signed)
Addressed in another phone note 

## 2017-04-20 DIAGNOSIS — I10 Essential (primary) hypertension: Secondary | ICD-10-CM | POA: Insufficient documentation

## 2017-04-25 ENCOUNTER — Ambulatory Visit (HOSPITAL_COMMUNITY)
Admission: RE | Admit: 2017-04-25 | Discharge: 2017-04-25 | Disposition: A | Discharge status: Home / Self Care | Payer: Medicaid Other | Source: Ambulatory Visit | Attending: Cardiology | Admitting: Cardiology

## 2017-04-25 ENCOUNTER — Encounter (HOSPITAL_COMMUNITY): Payer: Self-pay | Admitting: Cardiology

## 2017-04-25 VITALS — BP 107/80 | HR 90 | Wt 107.4 lb

## 2017-04-25 DIAGNOSIS — I421 Obstructive hypertrophic cardiomyopathy: Secondary | ICD-10-CM | POA: Insufficient documentation

## 2017-04-25 DIAGNOSIS — Z720 Tobacco use: Secondary | ICD-10-CM | POA: Diagnosis not present

## 2017-04-25 DIAGNOSIS — Z7982 Long term (current) use of aspirin: Secondary | ICD-10-CM | POA: Insufficient documentation

## 2017-04-25 DIAGNOSIS — Z79891 Long term (current) use of opiate analgesic: Secondary | ICD-10-CM | POA: Diagnosis not present

## 2017-04-25 DIAGNOSIS — I739 Peripheral vascular disease, unspecified: Secondary | ICD-10-CM | POA: Diagnosis not present

## 2017-04-25 DIAGNOSIS — I422 Other hypertrophic cardiomyopathy: Secondary | ICD-10-CM | POA: Insufficient documentation

## 2017-04-25 DIAGNOSIS — I10 Essential (primary) hypertension: Secondary | ICD-10-CM | POA: Insufficient documentation

## 2017-04-25 DIAGNOSIS — Z95810 Presence of automatic (implantable) cardiac defibrillator: Secondary | ICD-10-CM | POA: Insufficient documentation

## 2017-04-25 DIAGNOSIS — Z8249 Family history of ischemic heart disease and other diseases of the circulatory system: Secondary | ICD-10-CM | POA: Insufficient documentation

## 2017-04-25 DIAGNOSIS — E785 Hyperlipidemia, unspecified: Secondary | ICD-10-CM | POA: Diagnosis not present

## 2017-04-25 DIAGNOSIS — Z79899 Other long term (current) drug therapy: Secondary | ICD-10-CM | POA: Diagnosis not present

## 2017-04-25 DIAGNOSIS — Z8674 Personal history of sudden cardiac arrest: Secondary | ICD-10-CM | POA: Diagnosis not present

## 2017-04-25 DIAGNOSIS — F17200 Nicotine dependence, unspecified, uncomplicated: Secondary | ICD-10-CM | POA: Diagnosis not present

## 2017-04-25 MED ORDER — METOPROLOL SUCCINATE ER 25 MG PO TB24: 75.0000 mg | ORAL_TABLET | Freq: Two times a day (BID) | ORAL | 11 refills | Status: DC

## 2017-04-25 MED ORDER — ASPIRIN EC 81 MG PO TBEC: 81.0000 mg | DELAYED_RELEASE_TABLET | Freq: Every day | ORAL | 3 refills | Status: AC

## 2017-04-25 MED ORDER — ATORVASTATIN CALCIUM 40 MG PO TABS: 40.0000 mg | ORAL_TABLET | Freq: Every day | ORAL | 3 refills | Status: DC

## 2017-04-25 MED ORDER — VARENICLINE TARTRATE 0.5 MG X 11 & 1 MG X 42 PO MISC: ORAL | 0 refills | Status: DC

## 2017-04-25 NOTE — Progress Notes (Signed)
PCP: Dr. Darin EngelsAbraham Cardiology: Dr. Shirlee LatchMcLean  46 yo with history of HTN and smoking was admitted in 10/18 after suffering out of hospital cardiac arrest.  Rhythm was VT and she was defibrillated. She had left heart cath showing no CAD.  Echo and cardiac MRI in 10/18 were consistent with hypertrophic cardiomyopathy. She had a Medtronic ICD placed.  She recovered well from the arrest and is now home.   She went to see Dr. Jomarie LongsJoseph to discuss genetic testing for HCM.  However, she has Medicaid and this will not cover genetic testing.    Patient reports that her right leg feels "cold" at times.  Peripheral arterial dopplers in 12/18 showed moderate right CFA stenosis and narrowing throughout the SFAs and popliteals bilaterally.  She returns for followup of hypertrophic cardiomyopathy. She feels like her breathing has improved.  No dyspnea walking in house.  She does get short of breath walking around [employer] at times.  No orthopnea/PND.  No chest pain.  No lightheadedness/dizziness.  She is still smoking.   Labs (10/18): K 4, creatinine 1.18  PMH: 1. HTN 2. Active smoker 3. Plantar fasciitis 4. Hypertrophic cardiomyopathy: Patient had cardiac arrest in 10/18 thought to be related to HOCM.  - LHC (10/18): Normal coronaries - Echo (10/18): EF 60-65%, moderate asymmetric septal hypertrophy, LVOT gradient to 159 mmHg peak, mild MR, mild AI.  - Cardiac MRI (10/18): EF 59%, severe asymmetric septal hypertrophy, mitral valve SAM with moderate MR, LGE in the mid wall basal to mid anterior wall, normal RV size and systolic function.  5. Cardiac arrest: VT in 10/18.  She now has a Medtronic ICD.   6. PAD: Peripheral arterial dopplers in 12/18 showed moderate right CFA stenosis and narrowing throughout the SFAs and popliteals bilaterally.  ROS: All systems reviewed and negative except as per HPI.   FH: Mother with heart murmur, father deceased (?cardiac arrest).   Social History   Socioeconomic History  .  Marital status: Single    Spouse name: Not on file  . Number of children: Not on file  . Years of education: Not on file  . Highest education level: Not on file  Social Needs  . Financial resource strain: Not on file  . Food insecurity - worry: Not on file  . Food insecurity - inability: Not on file  . Transportation needs - medical: Not on file  . Transportation needs - non-medical: Not on file  Occupational History  . Not on file  Tobacco Use  . Smoking status: Current Every Day Smoker  . Smokeless tobacco: Current User  Substance and Sexual Activity  . Alcohol use: Yes  . Drug use: Yes    Types: Marijuana  . Sexual activity: Not on file  Other Topics Concern  . Not on file  Social History Narrative  . Not on file   Current Outpatient Medications  Medication Sig Dispense Refill  . folic acid (FOLVITE) 1 MG tablet Take 1 tablet (1 mg total) by mouth daily. 90 tablet 1  . HYDROcodone-acetaminophen (NORCO/VICODIN) 5-325 MG tablet Take 1-2 tablets by mouth every 6 (six) hours as needed for severe pain. 10 tablet 0  . metoprolol succinate (TOPROL-XL) 25 MG 24 hr tablet Take 3 tablets (75 mg total) by mouth 2 (two) times daily. Take with or immediately following a meal. 180 tablet 11  . verapamil (CALAN-SR) 180 MG CR tablet Take 1 tablet (180 mg total) by mouth at bedtime. 30 tablet 3  . aspirin EC  81 MG tablet Take 1 tablet (81 mg total) by mouth daily. 90 tablet 3  . atorvastatin (LIPITOR) 40 MG tablet Take 1 tablet (40 mg total) by mouth daily. 30 tablet 3  . varenicline (CHANTIX STARTING MONTH PAK) 0.5 MG X 11 & 1 MG X 42 tablet Take one 0.5 mg tablet by mouth once daily for 3 days, then increase to one 0.5 mg tablet twice daily for 4 days, then increase to one 1 mg tablet twice daily. 53 tablet 0   No current facility-administered medications for this encounter.    BP 107/80 (BP Location: Right Arm, Patient Position: Sitting, Cuff Size: Normal)   Pulse 90   Wt 107 lb 6.4 oz  (48.7 kg)   SpO2 100%   BMI 19.03 kg/m  General: NAD Neck: No JVD, no thyromegaly or thyroid nodule.  Lungs: Clear to auscultation bilaterally with normal respiratory effort. CV: Nondisplaced PMI.  Heart regular S1/S2, no S3/S4, 2/6 SEM RUSB.  No peripheral edema.  No carotid bruit.  Unable to palpate pedal pulses.   Abdomen: Soft, nontender, no hepatosplenomegaly, no distention.  Skin: Intact without lesions or rashes.  Neurologic: Alert and oriented x 3.  Psych: Normal affect. Extremities: No clubbing or cyanosis.  HEENT: Normal.   Assessment/Plan: 1. Hypertrophic obstructive cardiomyopathy: Cardiac MRI in 10/18 showed severe asymmetric septal hypertrophy with mitral valve SAM and mid wall LGE in the anterior wall.  These findings are suggestive of hypertrophic cardiomyopathy.  Echo in 10/18 showed LVOT gradient peak 159 mmHg. She has NYHA class II symptoms with dyspnea and fatigue, somewhat improved from prior.  - Continue verapamil CR 180 mg daily.  - Increase Toprol XL to 75 mg bid.   - Once beta blocker and non-dihydropyridine CCB have been titrated up as much as tolerated, will repeat echo to see if LVOT gradient is any lower.  If she remains significantly symptomatic, will need to consider septal myectomy.  - She saw genetic counselor but Medicaid will not cover testing.  Children and siblings will need to get echocardiograms done. 2. Cardiac arrest: VT.  Suspect this was related to HOCM.  She now has an ICD.   3. Smoking: I encouraged her to quit.  - She is interesting in trying Chantix, I gave her a prescription for this.  4. PAD: 12/18 dopplers showed significant PAD, worse on right (this leg feels "cold" at times).  No rest pain or ulcers.  - Start ASA 81 daily. - Start atorvastatin 40 mg daily with lipids/LFTs in 2 months. - I will get her a PV appointment. - I strongly encouraged her to stop smoking.   Marca AnconaDalton Krystena Reitter 04/25/2017

## 2017-04-25 NOTE — Patient Instructions (Signed)
Start Asprin 81 mg (1 tab) daily  Start Atorvastatin 40 mg (1 tab) daily  Start Chantix take as directed  Increase Metoprolol XL 75 mg (3 tabs), twice a day  Your physician recommends that you return for lab work in: 2 months   Your physician recommends that you schedule a follow-up appointment in: 6 weeks with Dr. Shirlee LatchMcLean

## 2017-05-06 ENCOUNTER — Ambulatory Visit (INDEPENDENT_AMBULATORY_CARE_PROVIDER_SITE_OTHER): Payer: Medicaid Other | Admitting: Cardiovascular Disease

## 2017-05-06 ENCOUNTER — Encounter: Payer: Self-pay | Admitting: Cardiovascular Disease

## 2017-05-06 DIAGNOSIS — I739 Peripheral vascular disease, unspecified: Secondary | ICD-10-CM | POA: Diagnosis not present

## 2017-05-06 NOTE — Progress Notes (Signed)
05/06/2017 Raven Martinez   1971/04/11  [id number]  Primary Physician [name], DO Primary Cardiologist: Runell Gess MD Nicholes Calamity, [place]  HPI:  Raven Martinez is a 47 y.o. thin appearing single African-American female mother of 4 children, grandmother one grandchild who currently is out of work. She was referred by Dr. Shirlee Latch for peripheral vascular evaluation because of abnormal Doppler studies. She has a history of true hypertension hyperlipidemia. She still continues to smoke 2 cigarettes a day. She has hypertrophic cardiomyopathy status post sudden cardiac death with resuscitation and cath that showed normal coronary arteries and LV function. She had an ICD implanted. She does complain of some right foot pain but really denies claudication. Recent Dopplers performed 04/06/17 revealed a right ABI 0.82 and a left of 0.87. She had mildly elevated velocities in the right common femoral artery.    Current Meds  Medication Sig  . aspirin EC 81 MG tablet Take 1 tablet (81 mg total) by mouth daily.  Marland Kitchen atorvastatin (LIPITOR) 40 MG tablet Take 1 tablet (40 mg total) by mouth daily.  . folic acid (FOLVITE) 1 MG tablet Take 1 tablet (1 mg total) by mouth daily.  . metoprolol succinate (TOPROL-XL) 25 MG 24 hr tablet Take 3 tablets (75 mg total) by mouth 2 (two) times daily. Take with or immediately following a meal.  . varenicline (CHANTIX STARTING MONTH PAK) 0.5 MG X 11 & 1 MG X 42 tablet Take one 0.5 mg tablet by mouth once daily for 3 days, then increase to one 0.5 mg tablet twice daily for 4 days, then increase to one 1 mg tablet twice daily.     No Known Allergies  Social History   Socioeconomic History  . Marital status: Single    Spouse name: Not on file  . Number of children: Not on file  . Years of education: Not on file  . Highest education level: Not on file  Social Needs  . Financial resource strain: Not on file  . Food insecurity - worry: Not on file  .  Food insecurity - inability: Not on file  . Transportation needs - medical: Not on file  . Transportation needs - non-medical: Not on file  Occupational History  . Not on file  Tobacco Use  . Smoking status: Current Every Day Smoker  . Smokeless tobacco: Current User  Substance and Sexual Activity  . Alcohol use: Yes  . Drug use: Yes    Types: Marijuana  . Sexual activity: Not on file  Other Topics Concern  . Not on file  Social History Narrative  . Not on file     Review of Systems: General: negative for chills, fever, night sweats or weight changes.  Cardiovascular: negative for chest pain, dyspnea on exertion, edema, orthopnea, palpitations, paroxysmal nocturnal dyspnea or shortness of breath Dermatological: negative for rash Respiratory: negative for cough or wheezing Urologic: negative for hematuria Abdominal: negative for nausea, vomiting, diarrhea, bright red blood per rectum, melena, or hematemesis Neurologic: negative for visual changes, syncope, or dizziness All other systems reviewed and are otherwise negative except as noted above.    Blood pressure 103/77, pulse 83, height 5\' 4"  (1.626 m), weight 108 lb 9.6 oz (49.3 kg), SpO2 100 %.  General appearance: alert and no distress Neck: no adenopathy, no carotid bruit, no JVD, supple, symmetrical, trachea midline and thyroid not enlarged, symmetric, no tenderness/mass/nodules Lungs: clear to auscultation bilaterally Heart: 2/6 outflow tract murmur at  the base consistent with IHSS. Extremities: extremities normal, atraumatic, no cyanosis or edema Pulses: 2+ and symmetric Skin: Skin color, texture, turgor normal. No rashes or lesions Neurologic: Alert and oriented X 3, normal strength and tone. Normal symmetric reflexes. Normal coordination and gait  EKG not performed today  ASSESSMENT AND PLAN:   Peripheral arterial disease (HCC) Raven Martinez was referred to me by Dr. Shirlee LatchMcLean for evaluation of PAD. She really denies  claudication but does complain of right foot pain which sounds more like a plantar fasciitis. She did have lower extremity Doppler studies performed 03/30/17 revealing moderately high-frequency signal in the right common femoral arteries. Her right ABI was 0.82 and left was 0.87. I cannot elicit a good story for claudication however. She has a palpable right pedal pulse and a right femoral bruit. We'll continue to follow her noninvasively on annual basis.      Runell GessJonathan J. Berry MD FACP,FACC,FAHA, [facility] 05/06/2017 2:26 PM

## 2017-05-06 NOTE — Patient Instructions (Signed)
Medication Instructions: Your physician recommends that you continue on your current medications as directed. Please refer to the Current Medication list given to you today.  Testing/Procedures: IN 1 YEAR: Your physician has requested that you have a lower extremity arterial duplex. During this test, ultrasound is used to evaluate arterial blood flow in the legs. Allow one hour for this exam. There are no restrictions or special instructions.  Your physician has requested that you have an ankle brachial index (ABI). During this test an ultrasound and blood pressure cuff are used to evaluate the arteries that supply the arms and legs with blood. Allow thirty minutes for this exam. There are no restrictions or special instructions.  Follow-Up: Your physician wants you to follow-up in: 1 year with Dr. Allyson SabalBerry. You will receive a reminder letter in the mail two months in advance. If you don't receive a letter, please call our office to schedule the follow-up appointment.  If you need a refill on your cardiac medications before your next appointment, please call your pharmacy.   [facility] [phone] [address], [place], [place], [id number]

## 2017-05-06 NOTE — Assessment & Plan Note (Signed)
Ms. Raven Martinez was referred to me by Raven Martinez for evaluation of PAD. She really denies claudication but does complain of right foot pain which sounds more like a plantar fasciitis. She did have lower extremity Doppler studies performed 03/30/17 revealing moderately high-frequency signal in the right common femoral arteries. Her right ABI was 0.82 and left was 0.87. I cannot elicit a good story for claudication however. She has a palpable right pedal pulse and a right femoral bruit. We'll continue to follow her noninvasively on annual basis.

## 2017-05-11 ENCOUNTER — Ambulatory Visit (INDEPENDENT_AMBULATORY_CARE_PROVIDER_SITE_OTHER): Payer: Medicaid Other | Admitting: Internal Medicine

## 2017-05-11 ENCOUNTER — Encounter: Payer: Self-pay | Admitting: Internal Medicine

## 2017-05-11 VITALS — BP 120/68 | HR 76 | Ht 64.0 in | Wt 114.0 lb

## 2017-05-11 DIAGNOSIS — I421 Obstructive hypertrophic cardiomyopathy: Secondary | ICD-10-CM

## 2017-05-11 DIAGNOSIS — I739 Peripheral vascular disease, unspecified: Secondary | ICD-10-CM

## 2017-05-11 DIAGNOSIS — I469 Cardiac arrest, cause unspecified: Secondary | ICD-10-CM

## 2017-05-11 DIAGNOSIS — Z736 Limitation of activities due to disability: Secondary | ICD-10-CM

## 2017-05-11 NOTE — Patient Instructions (Addendum)
Medication Instructions:  Your physician recommends that you continue on your current medications as directed. Please refer to the Current Medication list given to you today.   Labwork: None ordered   Testing/Procedures: None ordered   Follow-Up: Remote monitoring is used to monitor your  ICD from home. This monitoring reduces the number of office visits required to check your device to one time per year. It allows us to keep an eye on the functioning of your device to ensure it is working properly. You are scheduled for a device check from home on 08/10/17. You may send your transmission at any time that day. If you have a wireless device, the transmission will be sent automatically. After your physician reviews your transmission, you will receive a postcard with your next transmission date.  Your physician wants you to follow-up in: 6 months with Dr Jacquiline DoeAllred You will receive a reminder letter in the mail two months in advance. If you don't receive a letter, please call our office to schedule the follow-up appointment.    Any Other Special Instructions Will Be Listed Below (If Applicable).     If you need a refill on your cardiac medications before your next appointment, please call your pharmacy.

## 2017-05-11 NOTE — Progress Notes (Signed)
   PCP: Raven ManisAbraham, Sherin, DO Primary Cardiologist: Dr Raven Martinez Primary EP: Dr Raven Martinez  Raven Martinez is a [age] y.o. female who presents today for routine electrophysiology followup.  Since last being seen in our clinic with EP NP, the patient reports doing reasonably well.  + SOB.  Continues to smoke.  Today, she denies symptoms of palpitations, chest pain, lower extremity edema, dizziness, presyncope, syncope, or ICD shocks.  The patient is otherwise without complaint today.   Past Medical History:  Diagnosis Date  . Cardiac arrest [facility](HCC)    s/p rescucitation for VF with ICD implanted  . Hypertension   . Hypertrophic cardiomegaly    Past Surgical History:  Procedure Laterality Date  . CESAREAN SECTION    . ICD IMPLANT N/A 02/09/2017   Medtronic Visa AF MRI conditional ICD implanted for secondary prevention (HCM with VF arrest) by Dr Raven Martinez  . LEFT HEART CATH AND CORONARY ANGIOGRAPHY N/A 02/02/2017   Procedure: LEFT HEART CATH AND CORONARY ANGIOGRAPHY;  Surgeon: Lyn RecordsSmith, Raven W, MD;  Location: [facility] INVASIVE CV LAB;  Service: Cardiovascular;  Laterality: N/A;    ROS- all systems are reviewed and negative except as per HPI above  Current Outpatient Medications  Medication Sig Dispense Refill  . aspirin EC 81 MG tablet Take 1 tablet (81 mg total) by mouth daily. 90 tablet 3  . atorvastatin (LIPITOR) 40 MG tablet Take 1 tablet (40 mg total) by mouth daily. 30 tablet 3  . folic acid (FOLVITE) 1 MG tablet Take 1 tablet (1 mg total) by mouth daily. 90 tablet 1  . metoprolol succinate (TOPROL-XL) 25 MG 24 hr tablet Take 3 tablets (75 mg total) by mouth 2 (two) times daily. Take with or immediately following a meal. 180 tablet 11   No current facility-administered medications for this visit.     Physical Exam: Vitals:   05/11/17 1504  BP: 120/68  Pulse: 76  Weight: 114 lb (51.7 kg)  Height: 5\' 4"  (1.626 m)    GEN- The patient is thin appearing, alert and oriented x 3 today.   Head-  normocephalic, atraumatic Eyes-  Sclera clear, conjunctiva pink Ears- hearing intact Oropharynx- clear Lungs- Clear to ausculation bilaterally, normal work of breathing Chest- ICD pocket is well healed Heart- Regular rate and rhythm, 2/6 harsh systolic murmur LSB GI- soft, NT, ND, + BS Extremities- no clubbing, cyanosis, or edema  ICD interrogation- reviewed in detail today,  See PACEART report  ekg 03/28/17 is reviewd and reveals sinus rhythm with LVH and repolarization abnormality,  QT stable   Assessment and Plan:  1.  S/p VF arrest/ HCM/Long QT  euvolemic today Stable on an appropriate medical regimen Normal ICD function See Pace Art report No changes today QT appears stable  2. HCM Followed by AHF clinic She has been evaluated by genetics counselor  3. Tobacco Cessation advised She is not ready to quit  4. PAD Followed by Dr Katheren PullerBerry  [facility] Return to see EP NP every 6 months  Raven RangeJames Avah Bashor MD, [facility] 05/11/2017 3:43 PM

## 2017-05-12 ENCOUNTER — Encounter (HOSPITAL_COMMUNITY): Payer: Self-pay | Admitting: *Deleted

## 2017-05-12 LAB — CUP PACEART INCLINIC DEVICE CHECK
Battery Remaining Longevity: 136 mo
Battery Voltage: 3.12 V
Brady Statistic RV Percent Paced: 0 %
HighPow Impedance: 65 Ohm
Implantable Lead Location: 753860
Implantable Pulse Generator Implant Date: 20181017
Lead Channel Impedance Value: 437 Ohm
Lead Channel Pacing Threshold Pulse Width: 0.4 ms
Lead Channel Setting Pacing Amplitude: 2.5 V
Lead Channel Setting Pacing Pulse Width: 0.4 ms
MDC IDC LEAD IMPLANT DT: 20181017
MDC IDC MSMT LEADCHNL RV IMPEDANCE VALUE: 380 Ohm
MDC IDC MSMT LEADCHNL RV PACING THRESHOLD AMPLITUDE: 0.75 V
MDC IDC MSMT LEADCHNL RV SENSING INTR AMPL: 29.75 mV
MDC IDC MSMT LEADCHNL RV SENSING INTR AMPL: 30.625 mV
MDC IDC SESS DTM: 20190116212142
MDC IDC SET LEADCHNL RV SENSING SENSITIVITY: 0.3 mV

## 2017-05-12 NOTE — Progress Notes (Signed)
[facility]12[facility]2017  CASE # [id number].  Requested records faxed today to [phone].  Original request will be scanned to patient's electronic medical record.

## 2017-05-18 ENCOUNTER — Encounter: Payer: Self-pay | Admitting: Podiatry

## 2017-05-18 ENCOUNTER — Other Ambulatory Visit: Payer: Self-pay | Admitting: Podiatry

## 2017-05-18 ENCOUNTER — Ambulatory Visit: Payer: Medicaid Other | Admitting: Podiatry

## 2017-05-18 ENCOUNTER — Ambulatory Visit (INDEPENDENT_AMBULATORY_CARE_PROVIDER_SITE_OTHER): Payer: Medicaid Other

## 2017-05-18 DIAGNOSIS — M7750 Other enthesopathy of unspecified foot: Secondary | ICD-10-CM

## 2017-05-18 DIAGNOSIS — M722 Plantar fascial fibromatosis: Secondary | ICD-10-CM

## 2017-05-18 DIAGNOSIS — M79671 Pain in right foot: Secondary | ICD-10-CM

## 2017-05-18 MED ORDER — TRIAMCINOLONE ACETONIDE 10 MG/ML IJ SUSP
10.0000 mg | Freq: Once | INTRAMUSCULAR | Status: AC
  Administered 2017-05-18: 10 mg

## 2017-05-18 NOTE — Patient Instructions (Signed)

## 2017-05-18 NOTE — Progress Notes (Signed)
   Subjective:    Patient ID: Raven Martinez, female    DOB: 09/12/1970, 47 y.o.   MRN: [id number]  HPI    Review of Systems  All other systems reviewed and are negative.      Objective:   Physical Exam        Assessment & Plan:

## 2017-05-19 NOTE — Progress Notes (Signed)
Subjective:   Patient ID: Raven Martinez, female   DOB: 47 y.o.   MRN: [id number]   HPI Patient presents with caregiver with pain in the right plantar heel that is been present for about 4 months and she does not remember injury.  Patient does smoke and likes to be active   Review of Systems  All other systems reviewed and are negative.       Objective:  Physical Exam  Constitutional: She appears well-developed and well-nourished.  Cardiovascular: Intact distal pulses.  Pulmonary/Chest: Effort normal.  Musculoskeletal: Normal range of motion.  Neurological: She is alert.  Skin: Skin is warm.  Nursing note and vitals reviewed.   Neurovascular status intact muscle strength adequate range of motion within normal limits with exquisite discomfort plantar aspect right heel at the insertional point tendon calcaneus with inflammation fluid around the medial band.  Patient is found to have good digital perfusion and is well oriented x3 inflammatory fasciitis plantar heel region right at the insertion calcaneus     Assessment:  Above indicates fasciitis     Plan:  H&P x-ray reviewed and I injected the plantar fascia right 3 mg Kenalog pyelogram Xylocaine gave instructions on physical therapy and shoe gear modifications.  Reappoint to recheck in the next several weeks  X-rays indicate there is spur formation with no indication of stress fracture or arthritis

## 2017-06-08 DIAGNOSIS — Z736 Limitation of activities due to disability: Secondary | ICD-10-CM

## 2017-06-16 ENCOUNTER — Encounter (HOSPITAL_COMMUNITY): Payer: Medicaid Other | Admitting: Cardiology

## 2017-07-26 ENCOUNTER — Ambulatory Visit (HOSPITAL_COMMUNITY)
Admission: RE | Admit: 2017-07-26 | Discharge: 2017-07-26 | Disposition: A | Discharge status: Home / Self Care | Payer: Medicaid Other | Source: Ambulatory Visit | Attending: Cardiology | Admitting: Cardiology

## 2017-07-26 ENCOUNTER — Other Ambulatory Visit: Payer: Self-pay

## 2017-07-26 ENCOUNTER — Encounter (HOSPITAL_COMMUNITY): Payer: Self-pay | Admitting: Cardiology

## 2017-07-26 VITALS — BP 150/85 | HR 64 | Wt 122.5 lb

## 2017-07-26 DIAGNOSIS — I739 Peripheral vascular disease, unspecified: Secondary | ICD-10-CM | POA: Diagnosis not present

## 2017-07-26 DIAGNOSIS — I421 Obstructive hypertrophic cardiomyopathy: Secondary | ICD-10-CM | POA: Insufficient documentation

## 2017-07-26 DIAGNOSIS — I10 Essential (primary) hypertension: Secondary | ICD-10-CM | POA: Diagnosis not present

## 2017-07-26 DIAGNOSIS — Z720 Tobacco use: Secondary | ICD-10-CM

## 2017-07-26 DIAGNOSIS — E785 Hyperlipidemia, unspecified: Secondary | ICD-10-CM

## 2017-07-26 DIAGNOSIS — Z95810 Presence of automatic (implantable) cardiac defibrillator: Secondary | ICD-10-CM | POA: Diagnosis not present

## 2017-07-26 DIAGNOSIS — F17200 Nicotine dependence, unspecified, uncomplicated: Secondary | ICD-10-CM | POA: Insufficient documentation

## 2017-07-26 DIAGNOSIS — Z7982 Long term (current) use of aspirin: Secondary | ICD-10-CM | POA: Diagnosis not present

## 2017-07-26 DIAGNOSIS — Z8249 Family history of ischemic heart disease and other diseases of the circulatory system: Secondary | ICD-10-CM | POA: Insufficient documentation

## 2017-07-26 DIAGNOSIS — Z79899 Other long term (current) drug therapy: Secondary | ICD-10-CM | POA: Insufficient documentation

## 2017-07-26 DIAGNOSIS — Z8674 Personal history of sudden cardiac arrest: Secondary | ICD-10-CM | POA: Diagnosis not present

## 2017-07-26 LAB — LIPID PANEL
CHOL/HDL RATIO: 2.6 ratio
CHOLESTEROL: 123 mg/dL (ref 0–200)
HDL: 48 mg/dL (ref >40)
LDL Cholesterol: 53 mg/dL (ref 0–99)
Triglycerides: 110 mg/dL (ref <150)
VLDL: 22 mg/dL (ref 0–40)

## 2017-07-26 MED ORDER — METOPROLOL SUCCINATE ER 100 MG PO TB24: 100.0000 mg | ORAL_TABLET | Freq: Two times a day (BID) | ORAL | 11 refills | Status: DC

## 2017-07-26 NOTE — Patient Instructions (Signed)
Increase  Metoprolol XL (1 tab), twice a day  Labs drawn today (if we do not call you, then your lab work was stable)   Your physician recommends that you schedule a follow-up appointment in: 3 months (July, 2019) with Dr. Shirlee LatchMcLean  Please Call an Schedule Appointment

## 2017-07-26 NOTE — Progress Notes (Signed)
PCP: Dr. Darin Engels Cardiology: Dr. Shirlee Latch  47 y.o. with history of HTN and smoking was admitted in 10/18 after suffering out of hospital cardiac arrest.  Rhythm was VT and she was defibrillated. She had left heart cath showing no CAD.  Echo and cardiac MRI in 10/18 were consistent with hypertrophic cardiomyopathy. She had a Medtronic ICD placed.  She recovered well from the arrest and is now home.   She went to see Dr. Jomarie Longs to discuss genetic testing for HCM.  However, she has Medicaid and this will not cover genetic testing.    Patient reports that her right leg feels "cold" at times.  Peripheral arterial dopplers in 12/18 showed moderate right CFA stenosis and narrowing throughout the SFAs and popliteals bilaterally. She saw Dr. Allyson Sabal with plans for followup over time given fairly minimal symptoms.   She returns for followup of hypertrophic cardiomyopathy. Overall, her breathing is better.  She can walk around [place] without problems.  No dyspnea walking on flat ground.  OK with 1 flight steps.  No chest pain.  No lightheadedness/syncope. Still smoking.   Labs (10/18): K 4, creatinine 1.18  PMH: 1. HTN 2. Active smoker 3. Plantar fasciitis 4. Hypertrophic cardiomyopathy: Patient had cardiac arrest in 10/18 thought to be related to HOCM.  - LHC (10/18): Normal coronaries - Echo (10/18): EF 60-65%, moderate asymmetric septal hypertrophy, LVOT gradient to 159 mmHg peak, mild MR, mild AI.  - Cardiac MRI (10/18): EF 59%, severe asymmetric septal hypertrophy, mitral valve SAM with moderate MR, LGE in the mid wall basal to mid anterior wall, normal RV size and systolic function.  5. Cardiac arrest: VT in 10/18.  She now has a Medtronic ICD.   6. PAD: Peripheral arterial dopplers in 12/18 showed moderate right CFA stenosis and narrowing throughout the SFAs and popliteals bilaterally.  ROS: All systems reviewed and negative except as per HPI.   FH: Mother with heart murmur, father deceased  (?cardiac arrest).   Social History   Socioeconomic History  . Marital status: Single    Spouse name: Not on file  . Number of children: Not on file  . Years of education: Not on file  . Highest education level: Not on file  Occupational History  . Not on file  Social Needs  . Financial resource strain: Not on file  . Food insecurity:    Worry: Not on file    Inability: Not on file  . Transportation needs:    Medical: Not on file    Non-medical: Not on file  Tobacco Use  . Smoking status: Current Every Day Smoker  . Smokeless tobacco: Current User  Substance and Sexual Activity  . Alcohol use: Yes  . Drug use: Yes    Types: Marijuana  . Sexual activity: Not on file  Lifestyle  . Physical activity:    Days per week: Not on file    Minutes per session: Not on file  . Stress: Not on file  Relationships  . Social connections:    Talks on phone: Not on file    Gets together: Not on file    Attends religious service: Not on file    Active member of club or organization: Not on file    Attends meetings of clubs or organizations: Not on file    Relationship status: Not on file  . Intimate partner violence:    Fear of current or ex partner: Not on file    Emotionally abused: Not on file  Physically abused: Not on file    Forced sexual activity: Not on file  Other Topics Concern  . Not on file  Social History Narrative  . Not on file   Current Outpatient Medications  Medication Sig Dispense Refill  . aspirin EC 81 MG tablet Take 1 tablet (81 mg total) by mouth daily. 90 tablet 3  . atorvastatin (LIPITOR) 40 MG tablet Take 1 tablet (40 mg total) by mouth daily. 30 tablet 3  . folic acid (FOLVITE) 1 MG tablet Take 1 tablet (1 mg total) by mouth daily. 90 tablet 1  . metoprolol succinate (TOPROL-XL) 100 MG 24 hr tablet Take 1 tablet (100 mg total) by mouth 2 (two) times daily. Take with or immediately following a meal. 60 tablet 11  . verapamil (CALAN-SR) 180 MG CR  tablet Take 180 mg by mouth at bedtime.     No current facility-administered medications for this encounter.    BP (!) 150/85   Pulse 64   Wt 122 lb 8 oz (55.6 kg)   SpO2 100%   BMI 21.03 kg/m  General: NAD Neck: No JVD, no thyromegaly or thyroid nodule.  Lungs: Clear to auscultation bilaterally with normal respiratory effort. CV: Nondisplaced PMI.  Heart regular S1/S2, no S3/S4, 2/6 early SEM RUSB with clear S2.  No peripheral edema.  No carotid bruit.  Unable to palpate pedal pulses.  Abdomen: Soft, nontender, no hepatosplenomegaly, no distention.  Skin: Intact without lesions or rashes.  Neurologic: Alert and oriented x 3.  Psych: Normal affect. Extremities: No clubbing or cyanosis.  HEENT: Normal.   Assessment/Plan: 1. Hypertrophic obstructive cardiomyopathy: Cardiac MRI in 10/18 showed severe asymmetric septal hypertrophy with mitral valve SAM and mid wall LGE in the anterior wall.  These findings are suggestive of hypertrophic cardiomyopathy.  Echo in 10/18 showed LVOT gradient peak 159 mmHg. She has NYHA class II symptoms with dyspnea and fatigue, clearly improved from prior.  Murmur does not seem as loud.  - Continue verapamil CR 180 mg daily.  - Increase Toprol XL to 100 mg bid.   - Once beta blocker and non-dihydropyridine CCB have been titrated up as much as tolerated, will repeat echo to see if LVOT gradient is any lower (repeat in 10/19 at 1 year).  If she remains significantly symptomatic, will need to consider septal myectomy.  - She saw genetic counselor but Medicaid will not cover testing.  Children and siblings will need to get echocardiograms done => a couple of 1st degree relatives have had echoes but others still need echoes.  I encouraged her to talk with them again. - Avoid heavy lifting/strenuous exercise.  2. Cardiac arrest: VT.  Suspect this was related to HOCM.  She now has an ICD.   3. Smoking: I again encouraged her to quit.  - She has not taken Chantix, we  discussed this and she is willing to try it.  4. PAD: 12/18 dopplers showed significant PAD, worse on right (this leg feels "cold" at times).  No rest pain or ulcers.  No definite claudication.  - Continue ASA 81 daily. - Continue statin, check lipids today.  - She will followup with Dr. Allyson SabalBerry.  - I strongly encouraged her to stop smoking.   Followup in 3 months.   Marca AnconaDalton Prapti Grussing 07/26/2017

## 2017-07-27 ENCOUNTER — Encounter (HOSPITAL_COMMUNITY): Payer: Self-pay

## 2017-08-01 ENCOUNTER — Encounter (HOSPITAL_COMMUNITY): Payer: Self-pay

## 2017-08-01 ENCOUNTER — Telehealth (HOSPITAL_COMMUNITY): Payer: Self-pay

## 2017-08-01 NOTE — Telephone Encounter (Signed)
Pt wanted a work restriction note that was done signed by Dr. Shirlee LatchMcLean  And left up front desk for pick up by daughter Psychologist, occupationalDestiny Mccrackin.

## 2017-08-10 ENCOUNTER — Telehealth: Payer: Self-pay | Admitting: Cardiology

## 2017-08-10 ENCOUNTER — Encounter: Payer: Self-pay | Admitting: *Deleted

## 2017-08-10 NOTE — Telephone Encounter (Signed)
Attempted to confirm remote transmission with pt. No answer and was unable to leave a message.   

## 2017-08-11 ENCOUNTER — Encounter: Payer: Self-pay | Admitting: Cardiology

## 2017-08-18 ENCOUNTER — Telehealth: Payer: Self-pay | Admitting: Internal Medicine

## 2017-08-18 NOTE — Telephone Encounter (Signed)
Informed patient that we have not received any remotes since 02/10/17. I offered to help patient send in a manual transmission. Patient states that she is not home at the moment, but she plans to call back after 12pm for assistance. Direct phone number provided.

## 2017-08-18 NOTE — Telephone Encounter (Signed)
°  1. Has your device fired? no ° °2. Is you device beeping? no ° °3. Are you experiencing draining or swelling at device site?no ° °4. Are you calling to see if we received your device transmission?yes ° °5. Have you passed out? no ° ° ° °Please route to Device Clinic Pool °

## 2017-08-24 ENCOUNTER — Ambulatory Visit (INDEPENDENT_AMBULATORY_CARE_PROVIDER_SITE_OTHER): Payer: Self-pay | Admitting: *Deleted

## 2017-08-24 DIAGNOSIS — I469 Cardiac arrest, cause unspecified: Secondary | ICD-10-CM

## 2017-08-24 NOTE — Telephone Encounter (Signed)
Patient instructed on how to send remote transmission. Remote received and will be processed. Informed patient that she will receive a letter once it's reviewed. Patient verbalized understanding.

## 2017-08-25 ENCOUNTER — Encounter: Payer: Self-pay | Admitting: Cardiology

## 2017-08-25 LAB — CUP PACEART REMOTE DEVICE CHECK
Brady Statistic RV Percent Paced: 0 %
Date Time Interrogation Session: 20190501141834
HighPow Impedance: 70 Ohm
Implantable Lead Implant Date: 20181017
Implantable Lead Location: 753860
Lead Channel Impedance Value: 456 Ohm
Lead Channel Pacing Threshold Amplitude: 0.75 V
Lead Channel Sensing Intrinsic Amplitude: 31.125 mV
Lead Channel Setting Sensing Sensitivity: 0.3 mV
MDC IDC MSMT BATTERY REMAINING LONGEVITY: 134 mo
MDC IDC MSMT BATTERY VOLTAGE: 3.06 V
MDC IDC MSMT LEADCHNL RV IMPEDANCE VALUE: 342 Ohm
MDC IDC MSMT LEADCHNL RV PACING THRESHOLD PULSEWIDTH: 0.4 ms
MDC IDC MSMT LEADCHNL RV SENSING INTR AMPL: 31.125 mV
MDC IDC PG IMPLANT DT: 20181017
MDC IDC SET LEADCHNL RV PACING AMPLITUDE: 2 V
MDC IDC SET LEADCHNL RV PACING PULSEWIDTH: 0.4 ms

## 2017-08-25 NOTE — Progress Notes (Signed)
Remote ICD transmission.   

## 2017-10-05 ENCOUNTER — Telehealth: Payer: Self-pay | Admitting: Internal Medicine

## 2017-10-05 NOTE — Telephone Encounter (Signed)
New Message:       Pt is calling and states she needs a note to go back to work. She needs it to specifically have her restrictions.

## 2017-10-06 ENCOUNTER — Encounter (HOSPITAL_COMMUNITY): Payer: Self-pay | Admitting: Cardiology

## 2017-10-06 ENCOUNTER — Telehealth (HOSPITAL_COMMUNITY): Payer: Self-pay | Admitting: Cardiology

## 2017-10-06 NOTE — Telephone Encounter (Signed)
Patient called to request a updated letter to return to work, letter must include restrictions.  Letter written and left in front office

## 2017-10-07 NOTE — Telephone Encounter (Signed)
Letter signed by Dr. Shirlee LatchMcLean and patient requested for me to leave at front desk and family member will pick up.  Letter left at front desk.

## 2017-11-02 ENCOUNTER — Encounter (HOSPITAL_COMMUNITY): Payer: Self-pay | Admitting: Cardiology

## 2017-11-11 ENCOUNTER — Other Ambulatory Visit (HOSPITAL_COMMUNITY): Payer: Self-pay | Admitting: *Deleted

## 2017-11-11 MED ORDER — VERAPAMIL HCL ER 180 MG PO TBCR: 180.0000 mg | EXTENDED_RELEASE_TABLET | Freq: Every day | ORAL | 3 refills | Status: DC

## 2017-11-23 ENCOUNTER — Ambulatory Visit (INDEPENDENT_AMBULATORY_CARE_PROVIDER_SITE_OTHER): Payer: Medicaid Other | Admitting: *Deleted

## 2017-11-23 ENCOUNTER — Encounter: Payer: Self-pay | Admitting: Cardiology

## 2017-11-23 DIAGNOSIS — I469 Cardiac arrest, cause unspecified: Secondary | ICD-10-CM | POA: Diagnosis not present

## 2017-11-23 NOTE — Progress Notes (Signed)
Remote ICD transmission.   

## 2017-12-08 LAB — CUP PACEART REMOTE DEVICE CHECK
Battery Remaining Longevity: 133 mo
Battery Voltage: 3.03 V
Brady Statistic RV Percent Paced: 0 %
Date Time Interrogation Session: 20190731083625
HighPow Impedance: 63 Ohm
Lead Channel Impedance Value: 342 Ohm
Lead Channel Impedance Value: 437 Ohm
Lead Channel Sensing Intrinsic Amplitude: 31.625 mV
Lead Channel Setting Pacing Amplitude: 2 V
MDC IDC LEAD IMPLANT DT: 20181017
MDC IDC LEAD LOCATION: 753860
MDC IDC MSMT LEADCHNL RV PACING THRESHOLD AMPLITUDE: 0.75 V
MDC IDC MSMT LEADCHNL RV PACING THRESHOLD PULSEWIDTH: 0.4 ms
MDC IDC MSMT LEADCHNL RV SENSING INTR AMPL: 31.625 mV
MDC IDC PG IMPLANT DT: 20181017
MDC IDC SET LEADCHNL RV PACING PULSEWIDTH: 0.4 ms
MDC IDC SET LEADCHNL RV SENSING SENSITIVITY: 0.3 mV

## 2018-01-03 ENCOUNTER — Ambulatory Visit (HOSPITAL_COMMUNITY)
Admission: RE | Admit: 2018-01-03 | Discharge: 2018-01-03 | Disposition: A | Discharge status: Home / Self Care | Payer: Medicaid Other | Source: Ambulatory Visit | Attending: Cardiology | Admitting: Cardiology

## 2018-01-03 VITALS — BP 182/92 | HR 73 | Wt 126.4 lb

## 2018-01-03 DIAGNOSIS — Z95810 Presence of automatic (implantable) cardiac defibrillator: Secondary | ICD-10-CM | POA: Insufficient documentation

## 2018-01-03 DIAGNOSIS — Z8674 Personal history of sudden cardiac arrest: Secondary | ICD-10-CM | POA: Diagnosis not present

## 2018-01-03 DIAGNOSIS — I421 Obstructive hypertrophic cardiomyopathy: Secondary | ICD-10-CM | POA: Insufficient documentation

## 2018-01-03 DIAGNOSIS — I159 Secondary hypertension, unspecified: Secondary | ICD-10-CM | POA: Diagnosis not present

## 2018-01-03 DIAGNOSIS — I10 Essential (primary) hypertension: Secondary | ICD-10-CM | POA: Diagnosis not present

## 2018-01-03 DIAGNOSIS — Z7982 Long term (current) use of aspirin: Secondary | ICD-10-CM | POA: Insufficient documentation

## 2018-01-03 DIAGNOSIS — Z79899 Other long term (current) drug therapy: Secondary | ICD-10-CM | POA: Insufficient documentation

## 2018-01-03 DIAGNOSIS — Z8249 Family history of ischemic heart disease and other diseases of the circulatory system: Secondary | ICD-10-CM | POA: Insufficient documentation

## 2018-01-03 DIAGNOSIS — F17210 Nicotine dependence, cigarettes, uncomplicated: Secondary | ICD-10-CM | POA: Insufficient documentation

## 2018-01-03 DIAGNOSIS — I739 Peripheral vascular disease, unspecified: Secondary | ICD-10-CM | POA: Diagnosis not present

## 2018-01-03 NOTE — Patient Instructions (Signed)
Echocardiogram has been ordered for you at [facility] office.  They will call you to schedule your test.   Follow up in 4 months, please call our clinic in November/December to schedule your appointment. [phone], Option 3.

## 2018-01-03 NOTE — Progress Notes (Signed)
PCP: [name], DO Cardiology: Dr. Shirlee Latch  47 y.o. with history of HTN and smoking was admitted in 10/18 after suffering out of hospital cardiac arrest.  Rhythm was VT and she was defibrillated. She had left heart cath showing no CAD.  Echo and cardiac MRI in 10/18 were consistent with hypertrophic cardiomyopathy. She had a Medtronic ICD placed.  She recovered well from the arrest.   She went to see Dr. Jomarie Longs to discuss genetic testing for HCM.  However, she has Medicaid and this will not cover genetic testing.    Patient reports that her right leg feels "cold" at times.  Peripheral arterial dopplers in 12/18 showed moderate right CFA stenosis and narrowing throughout the SFAs and popliteals bilaterally. She saw Dr. Allyson Sabal with plans for followup over time given fairly minimal symptoms.   She returns for followup of hypertrophic cardiomyopathy. BP is quite high today.  She has been out of all her meds for a week.  She was waiting to get paid (today), and plans to pick up all her meds today.  She still feels good, no significant exertional dyspnea.  No chest pain. No lightheadedness/syncope. No palpitations.  She is working at [employer]; they are allowing her to limit lifting.  No claudication. She is still smoking 1-2 cigs/day.   Labs (10/18): K 4, creatinine 1.18 Labs (4/19): LDL 53, HDL 48  PMH: 1. HTN 2. Active smoker 3. Plantar fasciitis 4. Hypertrophic cardiomyopathy: Patient had cardiac arrest in 10/18 thought to be related to HOCM.  - LHC (10/18): Normal coronaries - Echo (10/18): EF 60-65%, moderate asymmetric septal hypertrophy, LVOT gradient to 159 mmHg peak, mild MR, mild AI.  - Cardiac MRI (10/18): EF 59%, severe asymmetric septal hypertrophy, mitral valve SAM with moderate MR, LGE in the mid wall basal to mid anterior wall, normal RV size and systolic function.  5. Cardiac arrest: VT in 10/18.  She now has a Medtronic ICD.   6. PAD: Peripheral arterial dopplers in 12/18  showed moderate right CFA stenosis and narrowing throughout the SFAs and popliteals bilaterally.  ROS: All systems reviewed and negative except as per HPI.   FH: Mother with heart murmur, father deceased (?cardiac arrest).   Social History   Socioeconomic History  . Marital status: Single    Spouse name: Not on file  . Number of children: Not on file  . Years of education: Not on file  . Highest education level: Not on file  Occupational History  . Not on file  Social Needs  . Financial resource strain: Not on file  . Food insecurity:    Worry: Not on file    Inability: Not on file  . Transportation needs:    Medical: Not on file    Non-medical: Not on file  Tobacco Use  . Smoking status: Current Every Day Smoker  . Smokeless tobacco: Current User  Substance and Sexual Activity  . Alcohol use: Yes  . Drug use: Yes    Types: Marijuana  . Sexual activity: Not on file  Lifestyle  . Physical activity:    Days per week: Not on file    Minutes per session: Not on file  . Stress: Not on file  Relationships  . Social connections:    Talks on phone: Not on file    Gets together: Not on file    Attends religious service: Not on file    Active member of club or organization: Not on file  Attends meetings of clubs or organizations: Not on file    Relationship status: Not on file  . Intimate partner violence:    Fear of current or ex partner: Not on file    Emotionally abused: Not on file    Physically abused: Not on file    Forced sexual activity: Not on file  Other Topics Concern  . Not on file  Social History Narrative  . Not on file   Current Outpatient Medications  Medication Sig Dispense Refill  . aspirin EC 81 MG tablet Take 1 tablet (81 mg total) by mouth daily. 90 tablet 3  . atorvastatin (LIPITOR) 40 MG tablet Take 1 tablet (40 mg total) by mouth daily. 30 tablet 3  . folic acid (FOLVITE) 1 MG tablet Take 1 tablet (1 mg total) by mouth daily. 90 tablet 1  .  metoprolol succinate (TOPROL-XL) 100 MG 24 hr tablet Take 1 tablet (100 mg total) by mouth 2 (two) times daily. Take with or immediately following a meal. 60 tablet 11  . verapamil (CALAN-SR) 180 MG CR tablet Take 1 tablet (180 mg total) by mouth at bedtime. 30 tablet 3   No current facility-administered medications for this encounter.    BP (!) 182/92   Pulse 73   Wt 57.3 kg (126 lb 6.4 oz)   SpO2 98%   BMI 21.70 kg/m  General: NAD Neck: No JVD, no thyromegaly or thyroid nodule.  Lungs: Clear to auscultation bilaterally with normal respiratory effort. CV: Nondisplaced PMI.  Heart regular S1/S2, no S3/S4, 2/6 systolic murmur along the upper sternal border.  No peripheral edema.  No carotid bruit.  Unable to palpate pedal pulses.  Abdomen: Soft, nontender, no hepatosplenomegaly, no distention.  Skin: Intact without lesions or rashes.  Neurologic: Alert and oriented x 3.  Psych: Normal affect. Extremities: No clubbing or cyanosis.  HEENT: Normal.   Assessment/Plan: 1. Hypertrophic obstructive cardiomyopathy: Cardiac MRI in 10/18 showed severe asymmetric septal hypertrophy with mitral valve SAM and mid wall LGE in the anterior wall.  These findings are suggestive of hypertrophic cardiomyopathy.  Echo in 10/18 showed LVOT gradient peak 159 mmHg. She has clearly improved symptomatically on verapamil and Toprol XL, but has been out of the meds for a week. Louder murmur on exam in absence of meds.  - Restart verapamil CR 180 mg daily.  - Restart Toprol XL to 100 mg bid.   - I told her to call us if she runs out of her meds again and cannot afford refills.  - I will arrange for echo in 10/19 once she restarts her medications to reassess LVOT gradient.   - She saw genetic counselor but Medicaid will not cover testing.  Children and siblings will need to get echocardiograms done => a couple of 1st degree relatives have had echoes but others still need echoes.  I encouraged her to talk with them  again. - Avoid heavy lifting/strenuous exercise. Her work is allowing her to limit lifting.  2. Cardiac arrest: VT.  Suspect this was related to HOCM.  She now has an ICD.   3. Smoking: I again encouraged her to quit. She is down to 1-2 cigs/day.  4. PAD: 12/18 dopplers showed significant PAD, worse on right (this leg feels "cold" at times).  No rest pain or ulcers.  No claudication.  - Continue ASA 81 daily. - Continue statin, good lipids in 4/19.  - She will followup with Dr. Allyson Sabal.  - I strongly encouraged her  to stop smoking.   Followup in 4 months.   Marca Ancona 01/03/2018

## 2018-01-11 ENCOUNTER — Other Ambulatory Visit (HOSPITAL_COMMUNITY): Payer: Self-pay

## 2018-01-11 MED ORDER — FOLIC ACID 1 MG PO TABS: 1.0000 mg | ORAL_TABLET | Freq: Every day | ORAL | 1 refills | Status: DC

## 2018-01-11 MED ORDER — ATORVASTATIN CALCIUM 40 MG PO TABS: 40.0000 mg | ORAL_TABLET | Freq: Every day | ORAL | 1 refills | Status: DC

## 2018-01-19 ENCOUNTER — Other Ambulatory Visit: Payer: Self-pay | Admitting: Nurse Practitioner

## 2018-01-19 NOTE — Telephone Encounter (Signed)
This is a CHF pt 

## 2018-02-02 ENCOUNTER — Ambulatory Visit (HOSPITAL_COMMUNITY): Payer: Medicaid Other | Attending: Cardiology

## 2018-02-10 ENCOUNTER — Encounter: Payer: Self-pay | Admitting: Cardiology

## 2018-02-22 ENCOUNTER — Encounter: Payer: Medicaid Other | Admitting: *Deleted

## 2018-02-22 ENCOUNTER — Telehealth: Payer: Self-pay | Admitting: Cardiology

## 2018-02-22 NOTE — Telephone Encounter (Signed)
Attempted to confirm remote transmission with pt. No answer and was unable to leave a message.   

## 2018-02-24 ENCOUNTER — Encounter: Payer: Self-pay | Admitting: Cardiology

## 2018-02-28 ENCOUNTER — Telehealth: Payer: Self-pay

## 2018-02-28 NOTE — Telephone Encounter (Signed)
New message    Just an FYI. We have made several attempts to contact this patient including sending a letter to schedule or reschedule their echocardiogram. We will be removing the patient from the echo WQ.   Thank you 

## 2018-04-11 ENCOUNTER — Telehealth: Payer: Self-pay

## 2018-04-11 ENCOUNTER — Ambulatory Visit (INDEPENDENT_AMBULATORY_CARE_PROVIDER_SITE_OTHER): Payer: Medicaid Other

## 2018-04-11 DIAGNOSIS — I422 Other hypertrophic cardiomyopathy: Secondary | ICD-10-CM

## 2018-04-11 DIAGNOSIS — I4901 Ventricular fibrillation: Secondary | ICD-10-CM

## 2018-04-11 NOTE — Telephone Encounter (Signed)
Spoke with Raven Martinez informed she was due to see a provider in Jan, Raven Martinez agreeable to see [name] on 04/28/2017 at 9:40am

## 2018-04-11 NOTE — Telephone Encounter (Signed)
Spoke with pt regarding shock from 12/17@ 7:19 pt reported feeling fine prior to and after shock, pt stated that she was taking Toprol-XL 100mg  BID as prescribed pt stated that she doesn't drive. Informed pt that this would be reviewed with Dr. Johney FrameAllred and would call back with any changes/recommendations pt voiced understanding.

## 2018-04-11 NOTE — Progress Notes (Signed)
Remote ICD transmission.   

## 2018-04-12 ENCOUNTER — Encounter: Payer: Self-pay | Admitting: Cardiology

## 2018-04-24 ENCOUNTER — Ambulatory Visit (HOSPITAL_COMMUNITY)
Admission: RE | Admit: 2018-04-24 | Payer: Medicaid Other | Source: Ambulatory Visit | Attending: Cardiovascular Disease | Admitting: Cardiovascular Disease

## 2018-04-28 ENCOUNTER — Encounter: Payer: Medicaid Other | Admitting: Nurse Practitioner

## 2018-04-28 NOTE — Progress Notes (Deleted)
Electrophysiology Office Note Date: 04/28/2018  ID:  Raven Martinez, DOB 12/15/1970, MRN [id number]  PCP: Oralia ManisAbraham, Sherin, DO Primary Cardiologist: Shirlee LatchMcLean Electrophysiologist: Allred  CC: Routine ICD follow-up  Raven Martinez is a 48 y.o. female seen today for Dr Johney FrameAllred.  She presents today for routine electrophysiology followup.  Since last being seen in our clinic, the patient reports doing very well. She denies chest pain, palpitations, dyspnea, PND, orthopnea, nausea, vomiting, dizziness, syncope, edema, weight gain, or early satiety.  She has not had ICD shocks.   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, HOCM, long QT History of appropriate therapy: No History of AAD therapy: No  Past Medical History:  Diagnosis Date  . Cardiac arrest [facility](HCC)    s/p rescucitation for VF with ICD implanted  . Hypertension   . Hypertrophic cardiomegaly    Past Surgical History:  Procedure Laterality Date  . CESAREAN SECTION    . ICD IMPLANT N/A 02/09/2017   Medtronic Visa AF MRI conditional ICD implanted for secondary prevention (HCM with VF arrest) by Dr Johney FrameAllred  . LEFT HEART CATH AND CORONARY ANGIOGRAPHY N/A 02/02/2017   Procedure: LEFT HEART CATH AND CORONARY ANGIOGRAPHY;  Surgeon: Lyn RecordsSmith, Henry W, MD;  [facility];  Service: Cardiovascular;  Laterality: N/A;    Current Outpatient Medications  Medication Sig Dispense Refill  . aspirin EC 81 MG tablet Take 1 tablet (81 mg total) by mouth daily. 90 tablet 3  . atorvastatin (LIPITOR) 40 MG tablet Take 1 tablet (40 mg total) by mouth daily. 90 tablet 1  . folic acid (FOLVITE) 1 MG tablet Take 1 tablet (1 mg total) by mouth daily. 90 tablet 1  . metoprolol succinate (TOPROL-XL) 100 MG 24 hr tablet Take 1 tablet (100 mg total) by mouth 2 (two) times daily. Take with or immediately following a meal. 60 tablet 11  . verapamil (CALAN-SR) 180 MG CR tablet Take 1 tablet (180 mg total) by mouth at  bedtime. 30 tablet 3   No current facility-administered medications for this visit.     Allergies:   Patient has no known allergies.   Social History: Social History   Socioeconomic History  . Marital status: Single    Spouse name: Not on file  . Number of children: Not on file  . Years of education: Not on file  . Highest education level: Not on file  Occupational History  . Not on file  Social Needs  . Financial resource strain: Not on file  . Food insecurity:    Worry: Not on file    Inability: Not on file  . Transportation needs:    Medical: Not on file    Non-medical: Not on file  Tobacco Use  . Smoking status: Current Every Day Smoker  . Smokeless tobacco: Current User  Substance and Sexual Activity  . Alcohol use: Yes  . Drug use: Yes    Types: Marijuana  . Sexual activity: Not on file  Lifestyle  . Physical activity:    Days per week: Not on file    Minutes per session: Not on file  . Stress: Not on file  Relationships  . Social connections:    Talks on phone: Not on file    Gets together: Not on file    Attends religious service: Not on file    Active member of club or organization: Not on file    Attends meetings of clubs or organizations:  Not on file    Relationship status: Not on file  . Intimate partner violence:    Fear of current or ex partner: Not on file    Emotionally abused: Not on file    Physically abused: Not on file    Forced sexual activity: Not on file  Other Topics Concern  . Not on file  Social History Narrative  . Not on file    Family History: Family History  Problem Relation Age of Onset  . Hypertension Mother   . Diabetes Mother   . Kidney disease Mother   . Heart attack Father   . Hypertension Father     Review of Systems: All other systems reviewed and are otherwise negative except as noted above.   Physical Exam: VS:  There were no vitals taken for this visit. , BMI There is no height or weight on file to  calculate BMI.  GEN- The patient is well appearing, alert and oriented x 3 today.   HEENT: normocephalic, atraumatic; sclera clear, conjunctiva pink; hearing intact; oropharynx clear; neck supple, no JVP Lymph- no cervical lymphadenopathy Lungs- Clear to ausculation bilaterally, normal work of breathing.  No wheezes, rales, rhonchi Heart- Regular rate and rhythm, no murmurs, rubs or gallops, PMI not laterally displaced GI- soft, non-tender, non-distended, bowel sounds present, no hepatosplenomegaly Extremities- no clubbing, cyanosis, or edema; DP/PT/radial pulses 2+ bilaterally MS- no significant deformity or atrophy Skin- warm and dry, no rash or lesion; ICD pocket well healed Psych- euthymic mood, full affect Neuro- strength and sensation are intact  ICD interrogation- reviewed in detail today,  See PACEART report  EKG:  EKG is not ordered today.  Recent Labs: No results found for requested labs within last 8760 hours.   Wt Readings from Last 3 Encounters:  01/03/18 126 lb 6.4 oz (57.3 kg)  07/26/17 122 lb 8 oz (55.6 kg)  05/11/17 114 lb (51.7 kg)     Other studies Reviewed: Additional studies/ records that were reviewed today include: Dr Jenel LucksAllred's office notes   Assessment and Plan:  1.  VF arrest/HOCM/Long QT Normal ICD function See Pace Art report No changes today Keep K >3.9, Mg >1.8  2.  HOCM Followed in AHF clinic  3.  Tobacco abuse Cessation advised   4.  PAD Followed by Dr Allyson SabalBerry   Current medicines are reviewed at length with the patient today.   The patient does not have concerns regarding her medicines.  The following changes were made today:  none  Labs/ tests ordered today include: none No orders of the defined types were placed in this encounter.    Disposition:   Follow up with [facility], me every 6 months    Signed, Gypsy BalsamAmber Tyanne Derocher, NP 04/28/2018 6:58 AM  [facility] [address] [place] [address] [phone]  (office) [phone] (fax)

## 2018-05-10 ENCOUNTER — Ambulatory Visit (HOSPITAL_COMMUNITY)
Admission: RE | Admit: 2018-05-10 | Payer: Medicaid Other | Source: Ambulatory Visit | Attending: Cardiovascular Disease | Admitting: Cardiovascular Disease

## 2018-05-12 ENCOUNTER — Encounter: Payer: Self-pay | Admitting: Internal Medicine

## 2018-05-14 LAB — CUP PACEART REMOTE DEVICE CHECK
Battery Remaining Longevity: 130 mo
Battery Voltage: 2.78 V
Brady Statistic RV Percent Paced: 0 %
Date Time Interrogation Session: 20191217121926
HighPow Impedance: 69 Ohm
Implantable Lead Location: 753860
Lead Channel Impedance Value: 437 Ohm
Lead Channel Pacing Threshold Pulse Width: 0.4 ms
Lead Channel Sensing Intrinsic Amplitude: 31.625 mV
Lead Channel Sensing Intrinsic Amplitude: 31.625 mV
Lead Channel Setting Pacing Amplitude: 2 V
Lead Channel Setting Pacing Pulse Width: 0.4 ms
MDC IDC LEAD IMPLANT DT: 20181017
MDC IDC MSMT LEADCHNL RV IMPEDANCE VALUE: 342 Ohm
MDC IDC MSMT LEADCHNL RV PACING THRESHOLD AMPLITUDE: 0.625 V
MDC IDC PG IMPLANT DT: 20181017
MDC IDC SET LEADCHNL RV SENSING SENSITIVITY: 0.3 mV

## 2018-05-19 ENCOUNTER — Other Ambulatory Visit (HOSPITAL_COMMUNITY): Payer: Self-pay

## 2018-05-19 MED ORDER — VERAPAMIL HCL ER 180 MG PO TBCR: 180.0000 mg | EXTENDED_RELEASE_TABLET | Freq: Every day | ORAL | 3 refills | Status: DC

## 2018-07-11 ENCOUNTER — Encounter: Payer: Medicaid Other | Admitting: *Deleted

## 2018-07-11 ENCOUNTER — Other Ambulatory Visit: Payer: Self-pay

## 2018-07-12 ENCOUNTER — Telehealth: Payer: Self-pay

## 2018-07-12 NOTE — Telephone Encounter (Signed)
Spoke with patient to remind of missed remote transmission 

## 2018-07-14 ENCOUNTER — Telehealth: Payer: Self-pay

## 2018-07-14 NOTE — Telephone Encounter (Signed)
Left message for patient to remind of missed remote transmission.  

## 2018-07-19 ENCOUNTER — Encounter: Payer: Self-pay | Admitting: Cardiology

## 2018-08-14 ENCOUNTER — Other Ambulatory Visit: Payer: Self-pay

## 2018-08-14 ENCOUNTER — Ambulatory Visit (INDEPENDENT_AMBULATORY_CARE_PROVIDER_SITE_OTHER): Payer: Medicaid Other | Admitting: *Deleted

## 2018-08-14 DIAGNOSIS — I422 Other hypertrophic cardiomyopathy: Secondary | ICD-10-CM

## 2018-08-14 LAB — CUP PACEART REMOTE DEVICE CHECK
Battery Remaining Longevity: 128 mo
Battery Voltage: 3.01 V
Brady Statistic RV Percent Paced: 0 %
Date Time Interrogation Session: 20200420082504
HighPow Impedance: 69 Ohm
Implantable Lead Implant Date: 20181017
Implantable Lead Location: 753860
Implantable Pulse Generator Implant Date: 20181017
Lead Channel Impedance Value: 380 Ohm
Lead Channel Impedance Value: 437 Ohm
Lead Channel Pacing Threshold Amplitude: 0.75 V
Lead Channel Pacing Threshold Pulse Width: 0.4 ms
Lead Channel Sensing Intrinsic Amplitude: 31.625 mV
Lead Channel Sensing Intrinsic Amplitude: 31.625 mV
Lead Channel Setting Pacing Amplitude: 2 V
Lead Channel Setting Pacing Pulse Width: 0.4 ms
Lead Channel Setting Sensing Sensitivity: 0.3 mV

## 2018-08-23 NOTE — Progress Notes (Signed)
Remote ICD transmission.   

## 2018-08-29 ENCOUNTER — Other Ambulatory Visit (HOSPITAL_COMMUNITY): Payer: Self-pay

## 2018-08-29 MED ORDER — METOPROLOL SUCCINATE ER 100 MG PO TB24: 100.0000 mg | ORAL_TABLET | Freq: Two times a day (BID) | ORAL | 4 refills | Status: DC

## 2018-08-29 NOTE — Telephone Encounter (Signed)
Received fax from pharmacy requesting refill for Toprol XL 100mg  BID

## 2018-10-09 ENCOUNTER — Other Ambulatory Visit (HOSPITAL_COMMUNITY): Payer: Self-pay

## 2018-10-09 MED ORDER — ATORVASTATIN CALCIUM 40 MG PO TABS: 40.0000 mg | ORAL_TABLET | Freq: Every day | ORAL | 1 refills | Status: DC

## 2018-10-09 MED ORDER — FOLIC ACID 1 MG PO TABS: 1.0000 mg | ORAL_TABLET | Freq: Every day | ORAL | 1 refills | Status: DC

## 2018-11-07 ENCOUNTER — Other Ambulatory Visit (HOSPITAL_COMMUNITY): Payer: Self-pay | Admitting: Cardiology

## 2018-11-13 ENCOUNTER — Encounter: Payer: Medicaid Other | Admitting: *Deleted

## 2018-11-14 ENCOUNTER — Telehealth: Payer: Self-pay

## 2018-11-14 NOTE — Telephone Encounter (Signed)
Unable to leave mssg, vm full

## 2018-11-28 ENCOUNTER — Encounter: Payer: Self-pay | Admitting: Cardiology

## 2018-12-01 ENCOUNTER — Ambulatory Visit (INDEPENDENT_AMBULATORY_CARE_PROVIDER_SITE_OTHER): Payer: Medicaid Other | Admitting: *Deleted

## 2018-12-01 DIAGNOSIS — I422 Other hypertrophic cardiomyopathy: Secondary | ICD-10-CM

## 2018-12-01 LAB — CUP PACEART REMOTE DEVICE CHECK
Battery Remaining Longevity: 125 mo
Battery Voltage: 3.01 V
Brady Statistic RV Percent Paced: 0 %
Date Time Interrogation Session: 20200807082504
HighPow Impedance: 69 Ohm
Implantable Lead Implant Date: 20181017
Implantable Lead Location: 753860
Implantable Pulse Generator Implant Date: 20181017
Lead Channel Impedance Value: 342 Ohm
Lead Channel Impedance Value: 437 Ohm
Lead Channel Pacing Threshold Amplitude: 0.5 V
Lead Channel Pacing Threshold Pulse Width: 0.4 ms
Lead Channel Sensing Intrinsic Amplitude: 31.625 mV
Lead Channel Sensing Intrinsic Amplitude: 31.625 mV
Lead Channel Setting Pacing Amplitude: 2 V
Lead Channel Setting Pacing Pulse Width: 0.4 ms
Lead Channel Setting Sensing Sensitivity: 0.3 mV

## 2018-12-05 ENCOUNTER — Encounter: Payer: Self-pay | Admitting: Cardiology

## 2018-12-05 NOTE — Progress Notes (Signed)
Remote ICD transmission.   

## 2019-01-31 ENCOUNTER — Other Ambulatory Visit (HOSPITAL_COMMUNITY): Payer: Self-pay | Admitting: Cardiology

## 2019-03-02 ENCOUNTER — Encounter: Payer: Medicaid Other | Admitting: *Deleted
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
## 2019-04-17 ENCOUNTER — Other Ambulatory Visit (HOSPITAL_COMMUNITY): Payer: Self-pay

## 2019-04-17 MED ORDER — VERAPAMIL HCL ER 180 MG PO TBCR: EXTENDED_RELEASE_TABLET | ORAL | 0 refills | Status: DC

## 2019-04-17 MED ORDER — METOPROLOL SUCCINATE ER 100 MG PO TB24: 100.0000 mg | ORAL_TABLET | Freq: Two times a day (BID) | ORAL | 4 refills | Status: DC

## 2019-04-28 IMAGING — DX DG CHEST 1V PORT
1 series · 1 of 1 positions shown · non-contrast
Comparison: Radiograph February 04, 2017.

CLINICAL DATA: Respiratory failure.

EXAM:
PORTABLE CHEST 1 VIEW

[chest ap]
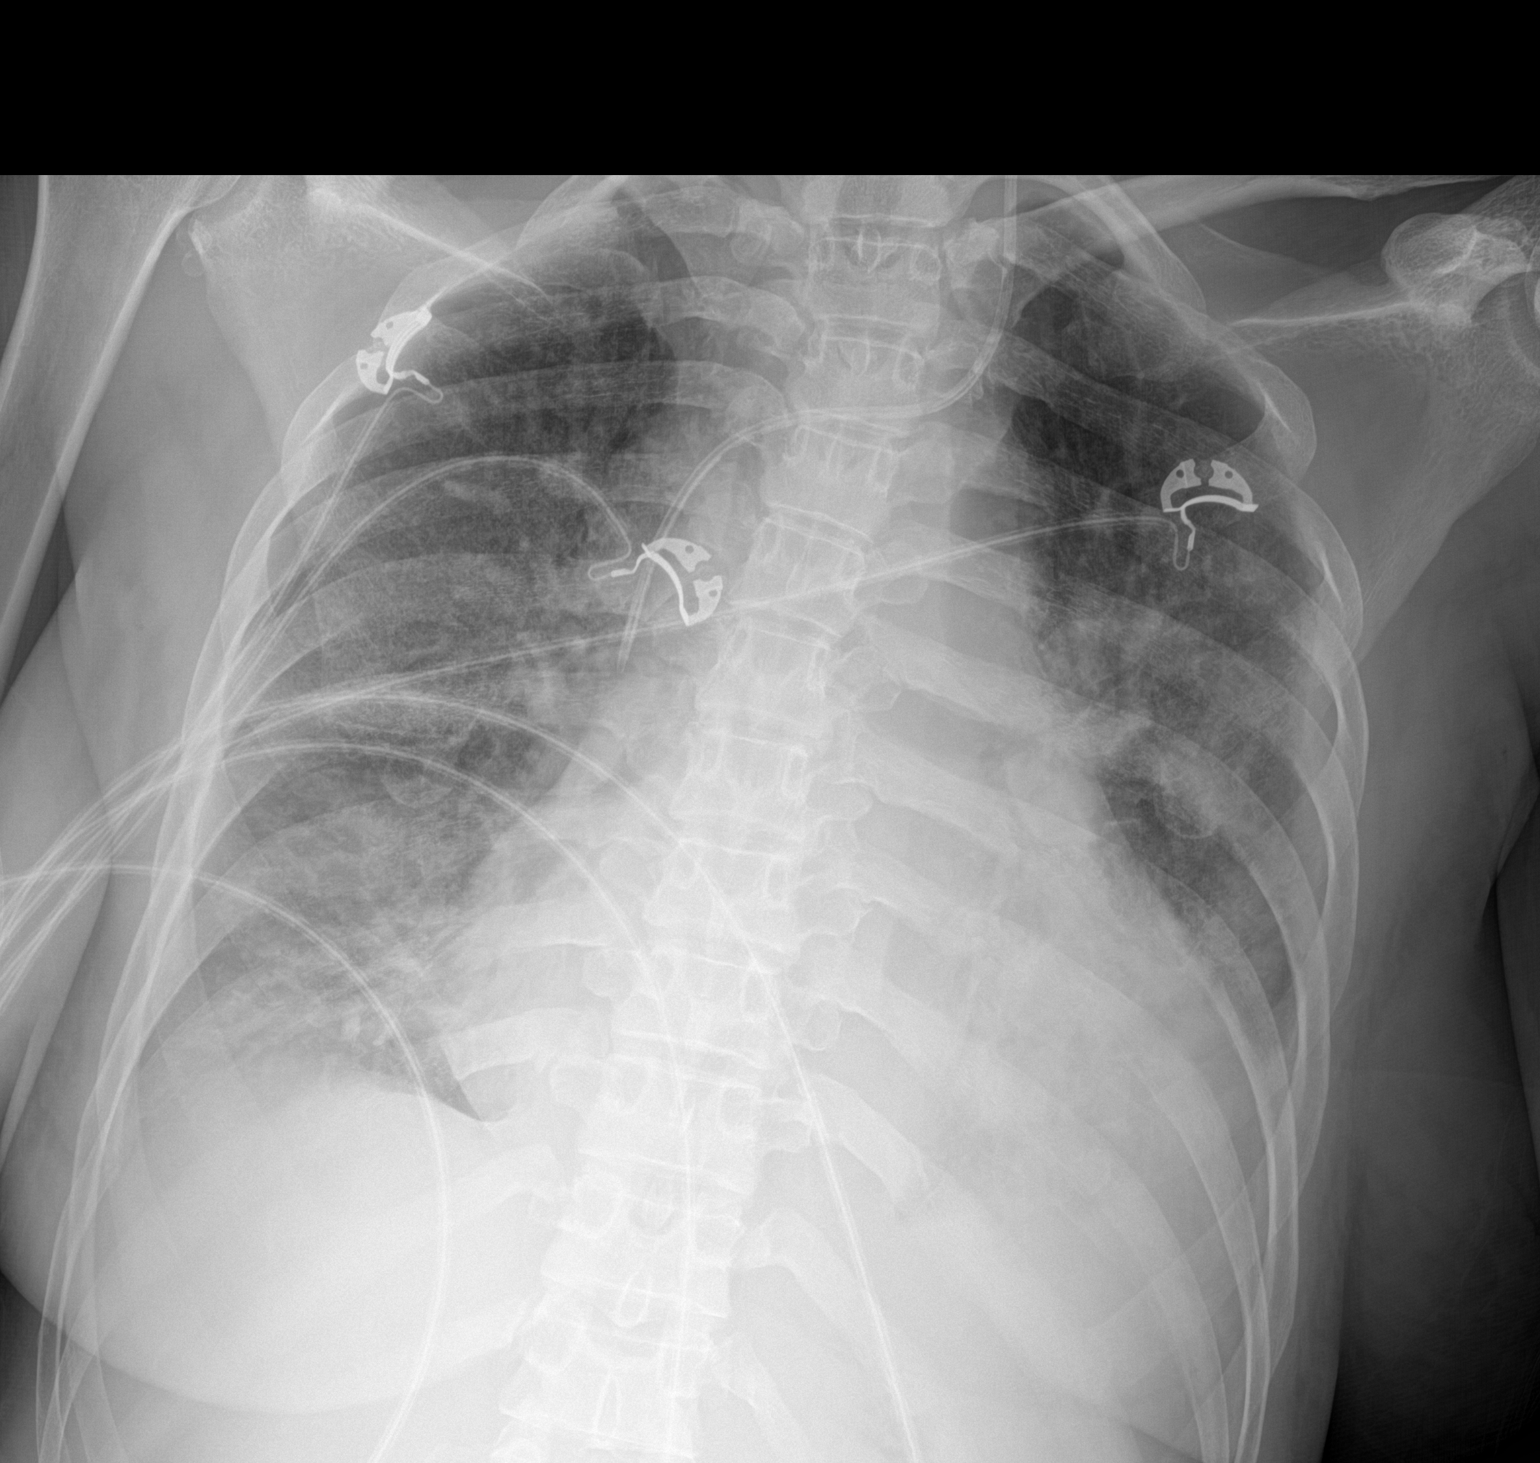

[1 of 1 positions shown; findings below may reference images not displayed]

FINDINGS: Stable cardiomegaly. Endotracheal tube has been removed. Left
internal jugular catheter is noted with distal tip in expected
position of the SVC. No pneumothorax is noted. Increased bilateral
perihilar and basilar interstitial densities are noted most
consistent with edema or less likely inflammation. Mild bilateral
pleural effusions are noted, left greater than right. Bony thorax is
unremarkable.
IMPRESSION: Endotracheal tube has been removed. Probably increased bilateral
pulmonary edema or inflammation is noted in both lungs. Bilateral
pleural effusions are noted as well.

## 2019-04-29 IMAGING — DX DG CHEST 1V PORT
1 series · 1 of 1 positions shown · non-contrast
Comparison: Prior radiograph from 02/05/2017.

CLINICAL DATA: Follow-up examination for pneumonia.

EXAM:
PORTABLE CHEST 1 VIEW

[chest ap]
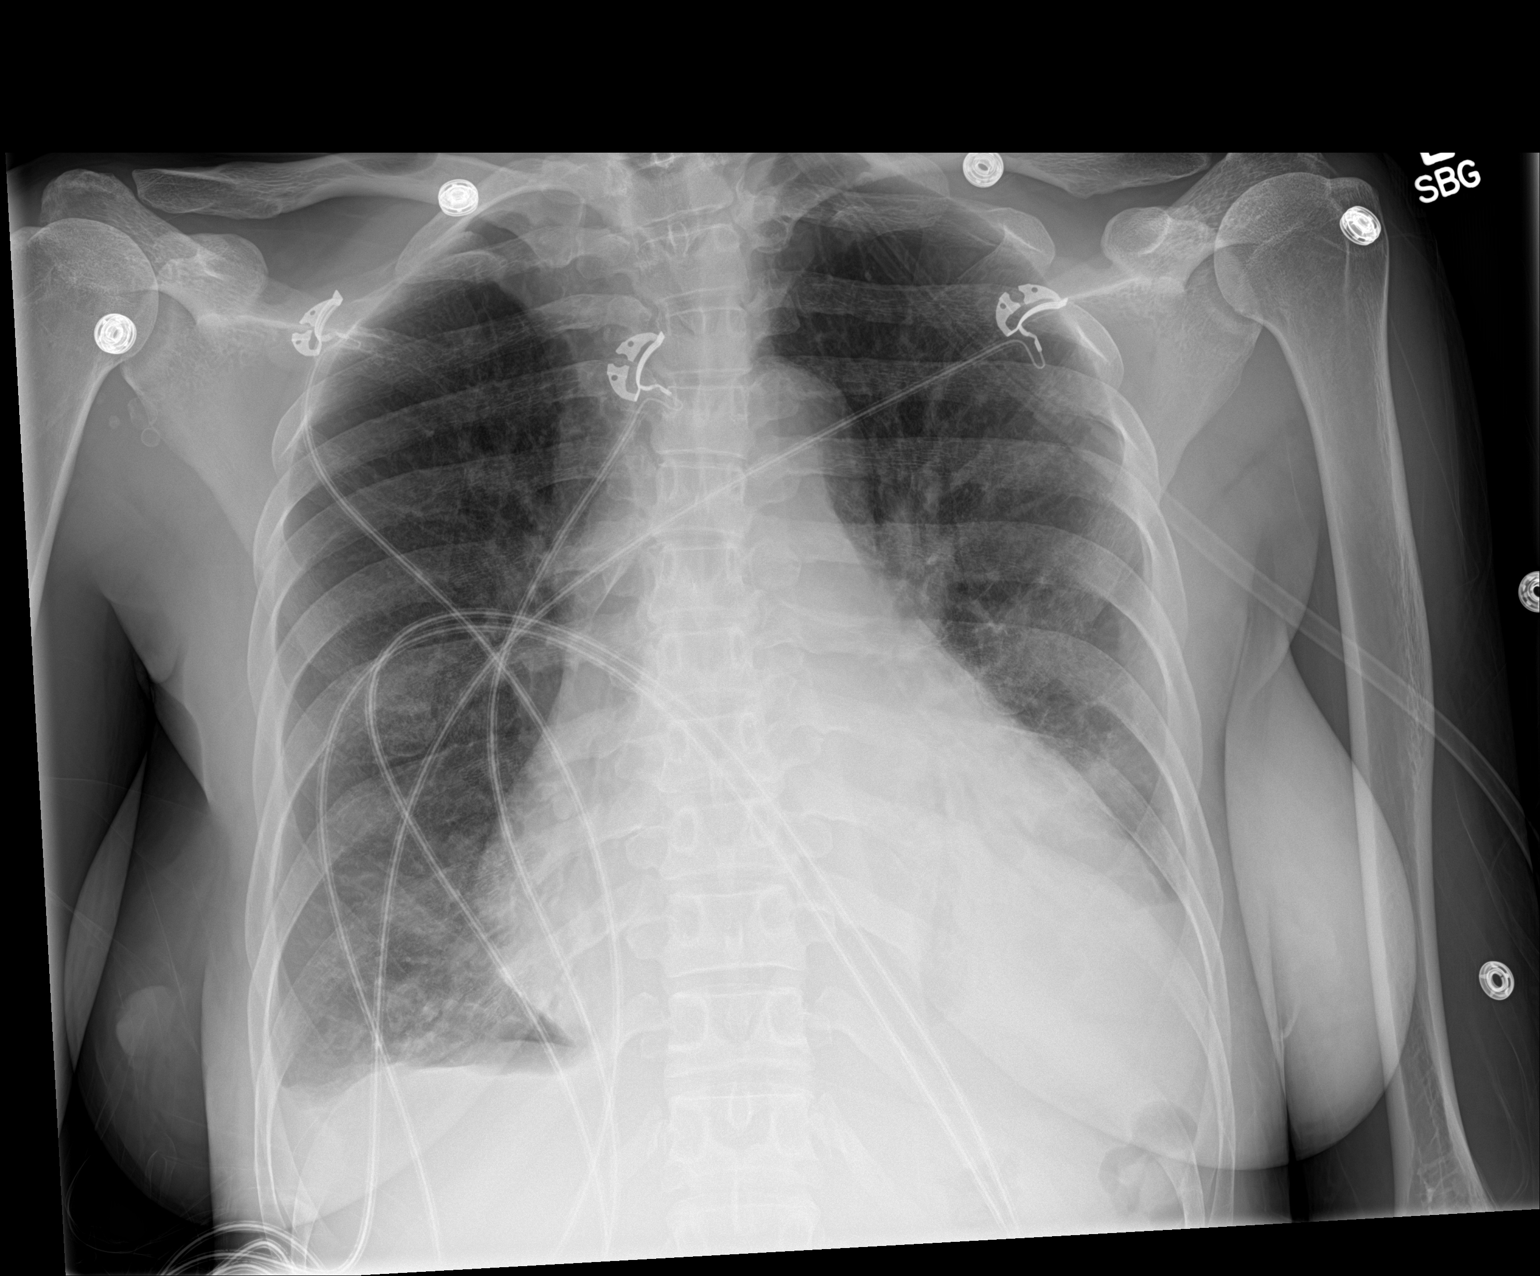

[1 of 1 positions shown; findings below may reference images not displayed]

FINDINGS: Stable cardiomegaly.  Mediastinal silhouette normal.

Lungs normally inflated. Improved pulmonary edema as compared to
previous. Persistent mild diffuse pulmonary interstitial congestion.
Small bilateral pleural effusions. Dense left lower lobe opacity
with associated air bronchograms, which may reflect atelectasis or
infiltrates. No other consolidative airspace disease. No
pneumothorax.

Osseous structures unchanged.
IMPRESSION: 1. Persistent dense left lower lobe opacity, which may reflect
atelectasis and/or consolidation.
2. Stable cardiomegaly with improved pulmonary edema as compared to
previous. Persistent mild diffuse interstitial congestion.
3. Persistent bilateral pleural effusions, left larger than right.

## 2019-05-01 IMAGING — DX DG CHEST 2V
2 series · 2 of 2 positions shown · non-contrast
Comparison: 02/06/2017

CLINICAL DATA: Shortness of Breath

EXAM:
CHEST  2 VIEW

[chest pa]
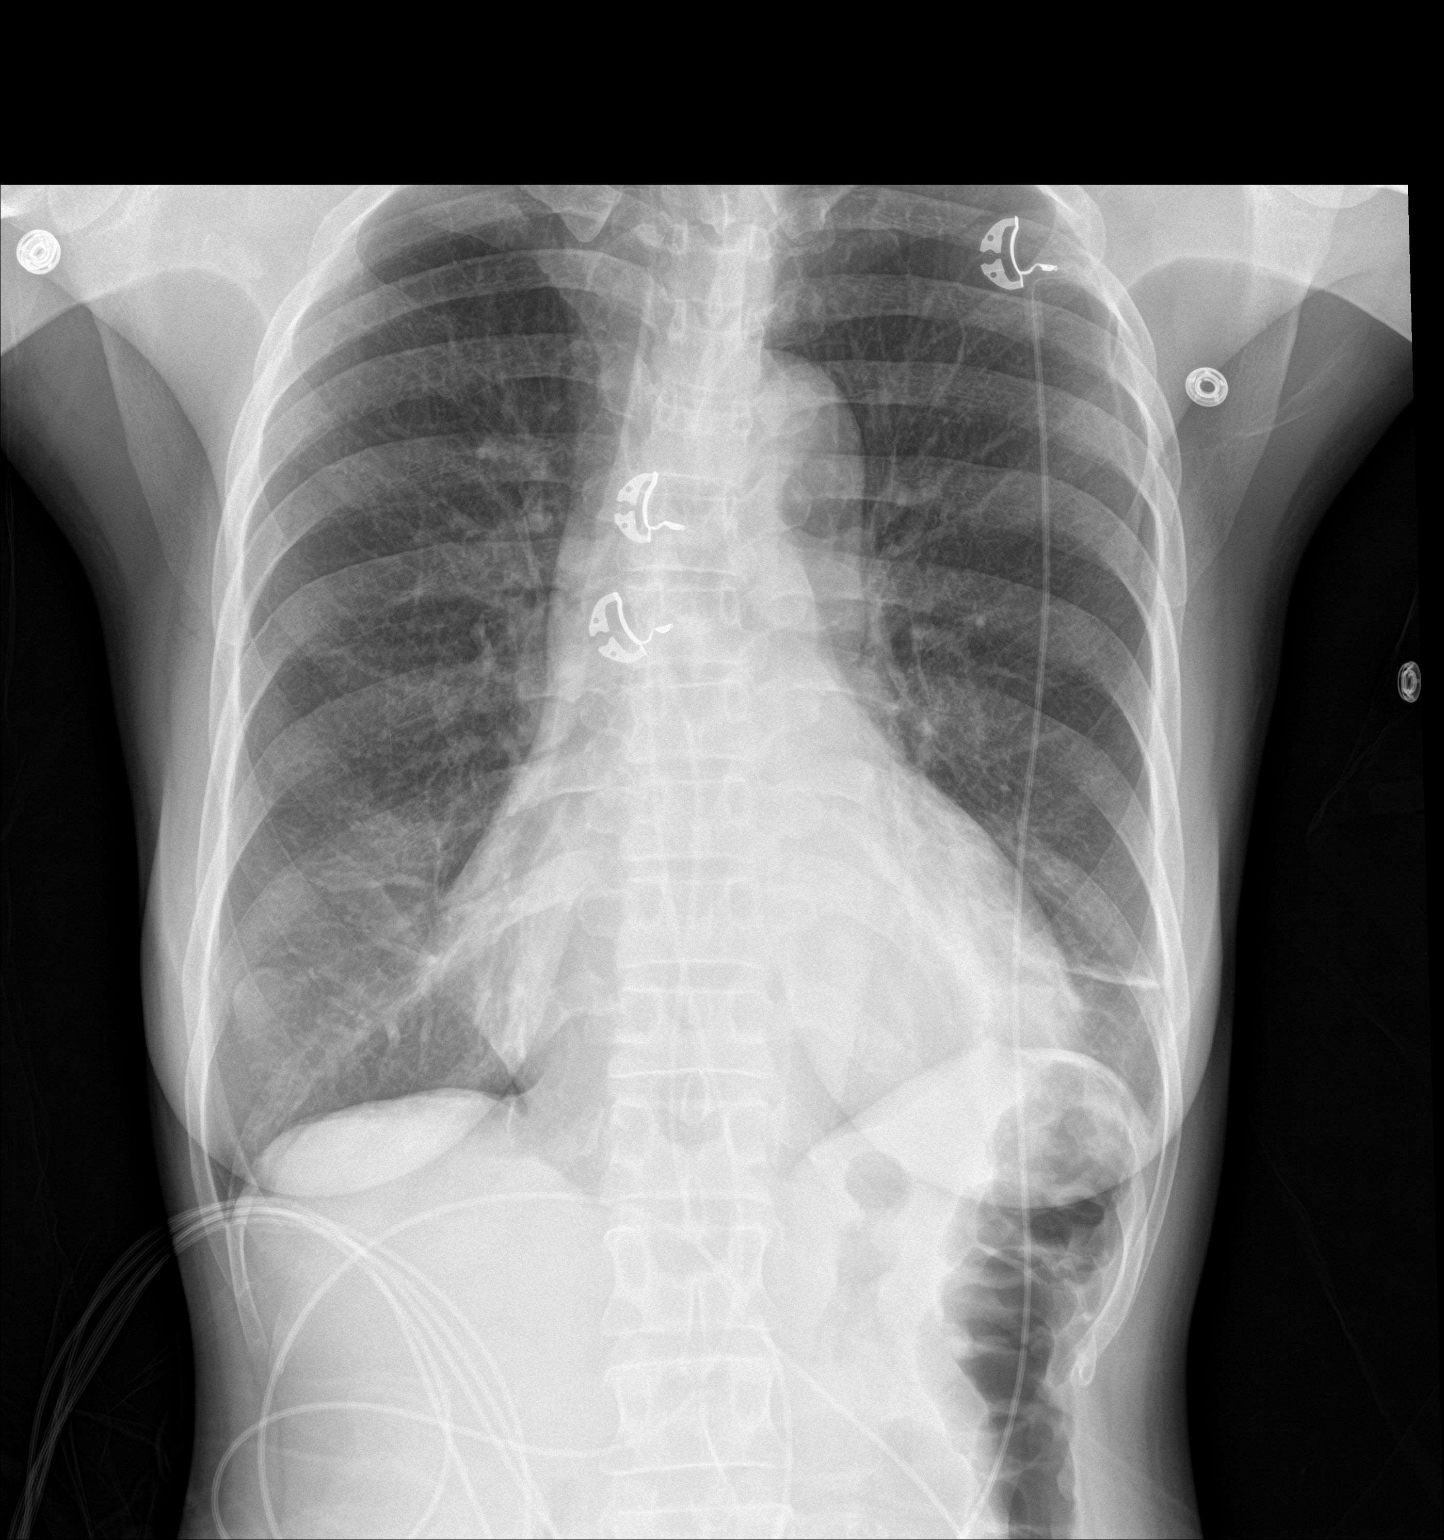

[chest lat]
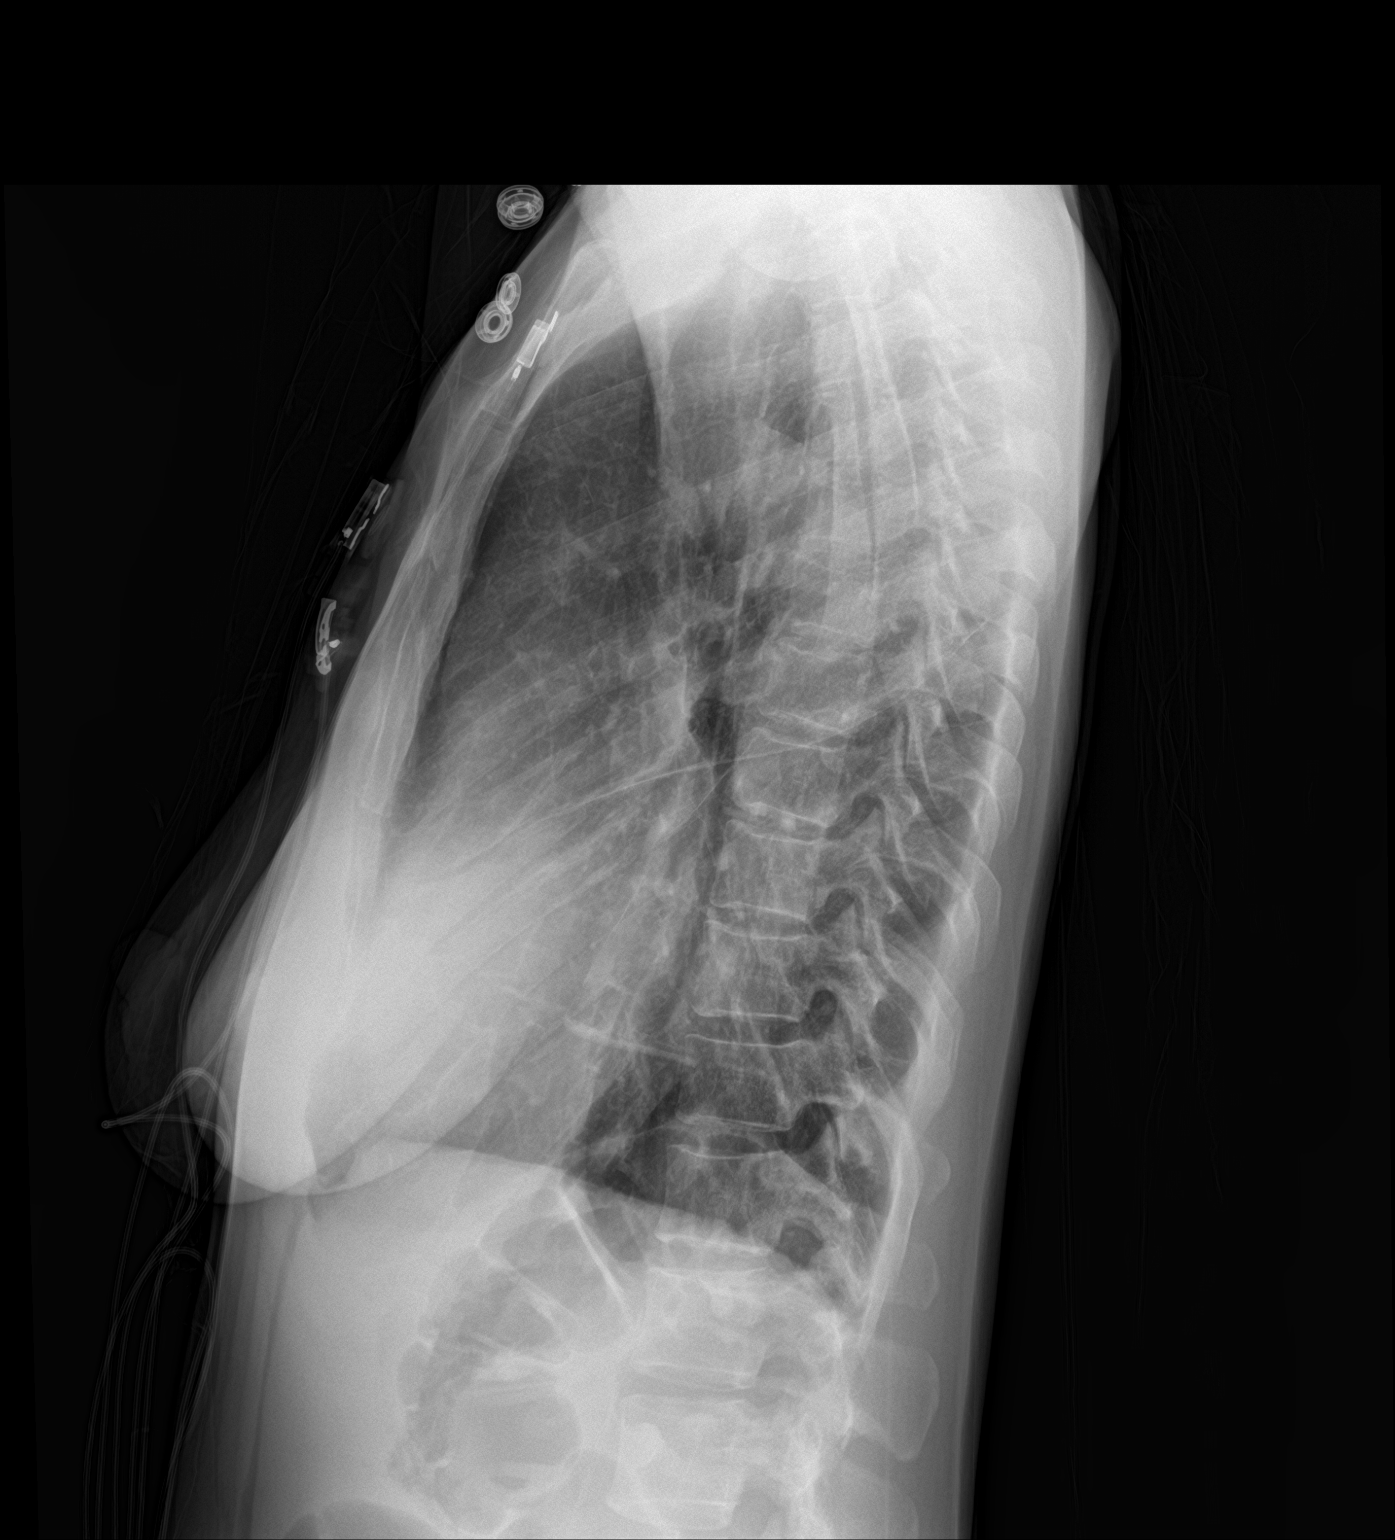

[2 of 2 positions shown; findings below may reference images not displayed]

FINDINGS: Cardiac shadow is enlarged. The lungs are well aerated bilaterally.
Mild left basilar atelectasis is seen but improved from the prior
study. Nipple shadow is noted over the right base. No bony
abnormality is seen.
IMPRESSION: Improving but persistent atelectatic changes in the left base.

## 2019-05-30 ENCOUNTER — Other Ambulatory Visit (HOSPITAL_COMMUNITY): Payer: Self-pay

## 2019-05-30 MED ORDER — ATORVASTATIN CALCIUM 40 MG PO TABS: 40.0000 mg | ORAL_TABLET | Freq: Every day | ORAL | 0 refills | Status: AC

## 2019-05-30 MED ORDER — VERAPAMIL HCL ER 180 MG PO TBCR: 180.0000 mg | EXTENDED_RELEASE_TABLET | Freq: Every day | ORAL | 0 refills | Status: AC

## 2019-05-30 MED ORDER — FOLIC ACID 1 MG PO TABS: 1.0000 mg | ORAL_TABLET | Freq: Every day | ORAL | 0 refills | Status: AC

## 2019-05-30 MED ORDER — METOPROLOL SUCCINATE ER 100 MG PO TB24: 100.0000 mg | ORAL_TABLET | Freq: Two times a day (BID) | ORAL | 0 refills | Status: AC

## 2019-06-01 ENCOUNTER — Ambulatory Visit (INDEPENDENT_AMBULATORY_CARE_PROVIDER_SITE_OTHER): Payer: Medicaid Other | Admitting: *Deleted

## 2019-06-01 ENCOUNTER — Encounter (HOSPITAL_COMMUNITY): Payer: Medicaid Other | Admitting: Cardiology

## 2019-06-01 DIAGNOSIS — I421 Obstructive hypertrophic cardiomyopathy: Secondary | ICD-10-CM | POA: Diagnosis not present

## 2019-06-01 LAB — CUP PACEART REMOTE DEVICE CHECK
Battery Remaining Longevity: 122 mo
Battery Voltage: 3.01 V
Brady Statistic RV Percent Paced: 0 %
Date Time Interrogation Session: 20210205043823
HighPow Impedance: 60 Ohm
Implantable Lead Implant Date: 20181016200000
Implantable Lead Location: 753860
Implantable Pulse Generator Implant Date: 20181016200000
Lead Channel Impedance Value: 342 Ohm
Lead Channel Impedance Value: 437 Ohm
Lead Channel Pacing Threshold Amplitude: 0.875 V
Lead Channel Pacing Threshold Pulse Width: 0.4 ms
Lead Channel Sensing Intrinsic Amplitude: 31.625 mV
Lead Channel Sensing Intrinsic Amplitude: 31.625 mV
Lead Channel Setting Pacing Amplitude: 2 V
Lead Channel Setting Pacing Pulse Width: 0.4 ms
Lead Channel Setting Sensing Sensitivity: 0.3 mV

## 2019-06-01 NOTE — Progress Notes (Signed)
ICD Remote  

## 2019-06-25 ENCOUNTER — Other Ambulatory Visit (HOSPITAL_COMMUNITY): Payer: Self-pay

## 2019-07-04 ENCOUNTER — Other Ambulatory Visit (HOSPITAL_COMMUNITY): Payer: Self-pay

## 2019-08-31 ENCOUNTER — Ambulatory Visit (INDEPENDENT_AMBULATORY_CARE_PROVIDER_SITE_OTHER): Payer: Medicaid Other | Admitting: *Deleted

## 2019-08-31 DIAGNOSIS — I4901 Ventricular fibrillation: Secondary | ICD-10-CM

## 2019-08-31 DIAGNOSIS — I421 Obstructive hypertrophic cardiomyopathy: Secondary | ICD-10-CM

## 2019-09-03 LAB — CUP PACEART REMOTE DEVICE CHECK
Battery Remaining Longevity: 120 mo
Battery Voltage: 3.01 V
Brady Statistic RV Percent Paced: 0 %
Date Time Interrogation Session: 20210507033524
HighPow Impedance: 67 Ohm
Implantable Lead Implant Date: 20181017
Implantable Lead Location: 753860
Implantable Pulse Generator Implant Date: 20181017
Lead Channel Impedance Value: 342 Ohm
Lead Channel Impedance Value: 437 Ohm
Lead Channel Pacing Threshold Amplitude: 0.75 V
Lead Channel Pacing Threshold Pulse Width: 0.4 ms
Lead Channel Sensing Intrinsic Amplitude: 31.625 mV
Lead Channel Sensing Intrinsic Amplitude: 31.625 mV
Lead Channel Setting Pacing Amplitude: 2 V
Lead Channel Setting Pacing Pulse Width: 0.4 ms
Lead Channel Setting Sensing Sensitivity: 0.3 mV

## 2019-09-03 NOTE — Progress Notes (Signed)
Remote ICD transmission.   

## 2019-11-30 ENCOUNTER — Ambulatory Visit (INDEPENDENT_AMBULATORY_CARE_PROVIDER_SITE_OTHER): Payer: Medicaid Other | Admitting: *Deleted

## 2019-11-30 DIAGNOSIS — I469 Cardiac arrest, cause unspecified: Secondary | ICD-10-CM

## 2019-11-30 LAB — CUP PACEART REMOTE DEVICE CHECK
Battery Remaining Longevity: 117 mo
Battery Voltage: 2.98 V
Brady Statistic RV Percent Paced: 0 %
Date Time Interrogation Session: 20210806121607
HighPow Impedance: 68 Ohm
Implantable Lead Implant Date: 20181017
Implantable Lead Location: 753860
Implantable Pulse Generator Implant Date: 20181017
Lead Channel Impedance Value: 342 Ohm
Lead Channel Impedance Value: 437 Ohm
Lead Channel Pacing Threshold Amplitude: 0.75 V
Lead Channel Pacing Threshold Pulse Width: 0.4 ms
Lead Channel Sensing Intrinsic Amplitude: 31.625 mV
Lead Channel Sensing Intrinsic Amplitude: 31.625 mV
Lead Channel Setting Pacing Amplitude: 2 V
Lead Channel Setting Pacing Pulse Width: 0.4 ms
Lead Channel Setting Sensing Sensitivity: 0.3 mV

## 2019-12-03 NOTE — Progress Notes (Signed)
Remote ICD transmission.   

## 2020-02-29 ENCOUNTER — Ambulatory Visit (INDEPENDENT_AMBULATORY_CARE_PROVIDER_SITE_OTHER): Payer: Medicaid Other

## 2020-02-29 DIAGNOSIS — I4901 Ventricular fibrillation: Secondary | ICD-10-CM | POA: Diagnosis not present

## 2020-02-29 LAB — CUP PACEART REMOTE DEVICE CHECK
Battery Remaining Longevity: 110 mo
Battery Voltage: 3 V
Brady Statistic RV Percent Paced: 0 %
Date Time Interrogation Session: 20211105001603
HighPow Impedance: 71 Ohm
Implantable Lead Implant Date: 20181017
Implantable Lead Location: 753860
Implantable Pulse Generator Implant Date: 20181017
Lead Channel Impedance Value: 342 Ohm
Lead Channel Impedance Value: 456 Ohm
Lead Channel Pacing Threshold Amplitude: 0.875 V
Lead Channel Pacing Threshold Pulse Width: 0.4 ms
Lead Channel Sensing Intrinsic Amplitude: 31.625 mV
Lead Channel Sensing Intrinsic Amplitude: 31.625 mV
Lead Channel Setting Pacing Amplitude: 2 V
Lead Channel Setting Pacing Pulse Width: 0.4 ms
Lead Channel Setting Sensing Sensitivity: 0.3 mV

## 2020-02-29 NOTE — Progress Notes (Signed)
Remote ICD transmission.   

## 2020-05-29 LAB — CUP PACEART REMOTE DEVICE CHECK
Battery Remaining Longevity: 104 mo
Battery Voltage: 3 V
Brady Statistic RV Percent Paced: 0.01 %
Date Time Interrogation Session: 20220203033324
HighPow Impedance: 69 Ohm
Implantable Lead Implant Date: 20181017
Implantable Lead Location: 753860
Implantable Pulse Generator Implant Date: 20181017
Lead Channel Impedance Value: 342 Ohm
Lead Channel Impedance Value: 437 Ohm
Lead Channel Pacing Threshold Amplitude: 0.875 V
Lead Channel Pacing Threshold Pulse Width: 0.4 ms
Lead Channel Sensing Intrinsic Amplitude: 31.625 mV
Lead Channel Sensing Intrinsic Amplitude: 31.625 mV
Lead Channel Setting Pacing Amplitude: 2 V
Lead Channel Setting Pacing Pulse Width: 0.4 ms
Lead Channel Setting Sensing Sensitivity: 0.3 mV

## 2020-05-30 ENCOUNTER — Ambulatory Visit (INDEPENDENT_AMBULATORY_CARE_PROVIDER_SITE_OTHER): Payer: Medicaid Other

## 2020-05-30 DIAGNOSIS — I469 Cardiac arrest, cause unspecified: Secondary | ICD-10-CM | POA: Diagnosis not present

## 2020-06-03 NOTE — Progress Notes (Signed)
Remote ICD transmission.   

## 2020-08-29 ENCOUNTER — Ambulatory Visit (INDEPENDENT_AMBULATORY_CARE_PROVIDER_SITE_OTHER): Payer: Medicaid Other

## 2020-08-29 DIAGNOSIS — I4901 Ventricular fibrillation: Secondary | ICD-10-CM | POA: Diagnosis not present

## 2020-08-29 LAB — CUP PACEART REMOTE DEVICE CHECK
Battery Remaining Longevity: 99 mo
Battery Voltage: 2.98 V
Brady Statistic RV Percent Paced: 0.01 %
Date Time Interrogation Session: 20220505162305
HighPow Impedance: 70 Ohm
Implantable Lead Implant Date: 20181017
Implantable Lead Location: 753860
Implantable Pulse Generator Implant Date: 20181017
Lead Channel Impedance Value: 342 Ohm
Lead Channel Impedance Value: 456 Ohm
Lead Channel Pacing Threshold Amplitude: 0.75 V
Lead Channel Pacing Threshold Pulse Width: 0.4 ms
Lead Channel Sensing Intrinsic Amplitude: 31.625 mV
Lead Channel Sensing Intrinsic Amplitude: 31.625 mV
Lead Channel Setting Pacing Amplitude: 2 V
Lead Channel Setting Pacing Pulse Width: 0.4 ms
Lead Channel Setting Sensing Sensitivity: 0.3 mV

## 2020-09-15 NOTE — Progress Notes (Signed)
Remote ICD transmission.   

## 2020-11-27 LAB — CUP PACEART REMOTE DEVICE CHECK
Battery Remaining Longevity: 89 mo
Battery Voltage: 2.99 V
Brady Statistic RV Percent Paced: 0.01 %
Date Time Interrogation Session: 20220804022824
HighPow Impedance: 72 Ohm
Implantable Lead Implant Date: 20181017
Implantable Lead Location: 753860
Implantable Pulse Generator Implant Date: 20181017
Lead Channel Impedance Value: 342 Ohm
Lead Channel Impedance Value: 456 Ohm
Lead Channel Pacing Threshold Amplitude: 0.75 V
Lead Channel Pacing Threshold Pulse Width: 0.4 ms
Lead Channel Sensing Intrinsic Amplitude: 28.75 mV
Lead Channel Sensing Intrinsic Amplitude: 28.75 mV
Lead Channel Setting Pacing Amplitude: 2 V
Lead Channel Setting Pacing Pulse Width: 0.4 ms
Lead Channel Setting Sensing Sensitivity: 0.3 mV

## 2020-11-28 ENCOUNTER — Ambulatory Visit (INDEPENDENT_AMBULATORY_CARE_PROVIDER_SITE_OTHER): Payer: Medicaid Other

## 2020-11-28 DIAGNOSIS — I4901 Ventricular fibrillation: Secondary | ICD-10-CM

## 2020-12-18 NOTE — Progress Notes (Signed)
Remote ICD transmission.   

## 2021-02-26 LAB — CUP PACEART REMOTE DEVICE CHECK
Battery Remaining Longevity: 83 mo
Battery Voltage: 2.98 V
Brady Statistic RV Percent Paced: 0.01 %
Date Time Interrogation Session: 20221103052506
HighPow Impedance: 69 Ohm
Implantable Lead Implant Date: 20181017
Implantable Lead Location: 753860
Implantable Pulse Generator Implant Date: 20181017
Lead Channel Impedance Value: 342 Ohm
Lead Channel Impedance Value: 456 Ohm
Lead Channel Pacing Threshold Amplitude: 0.75 V
Lead Channel Pacing Threshold Pulse Width: 0.4 ms
Lead Channel Sensing Intrinsic Amplitude: 30 mV
Lead Channel Sensing Intrinsic Amplitude: 30 mV
Lead Channel Setting Pacing Amplitude: 2 V
Lead Channel Setting Pacing Pulse Width: 0.4 ms
Lead Channel Setting Sensing Sensitivity: 0.3 mV

## 2021-02-27 ENCOUNTER — Ambulatory Visit (INDEPENDENT_AMBULATORY_CARE_PROVIDER_SITE_OTHER): Payer: Medicaid Other

## 2021-02-27 DIAGNOSIS — I469 Cardiac arrest, cause unspecified: Secondary | ICD-10-CM

## 2021-03-04 NOTE — Progress Notes (Signed)
Remote ICD transmission.   

## 2021-05-28 LAB — CUP PACEART REMOTE DEVICE CHECK
Battery Remaining Longevity: 83 mo
Battery Voltage: 2.99 V
Brady Statistic RV Percent Paced: 0.01 %
Date Time Interrogation Session: 20230202033424
HighPow Impedance: 72 Ohm
Implantable Lead Implant Date: 20181017
Implantable Lead Location: 753860
Implantable Pulse Generator Implant Date: 20181017
Lead Channel Impedance Value: 342 Ohm
Lead Channel Impedance Value: 456 Ohm
Lead Channel Pacing Threshold Amplitude: 0.75 V
Lead Channel Pacing Threshold Pulse Width: 0.4 ms
Lead Channel Sensing Intrinsic Amplitude: 31.625 mV
Lead Channel Sensing Intrinsic Amplitude: 31.625 mV
Lead Channel Setting Pacing Amplitude: 2 V
Lead Channel Setting Pacing Pulse Width: 0.4 ms
Lead Channel Setting Sensing Sensitivity: 0.3 mV

## 2021-05-29 ENCOUNTER — Ambulatory Visit (INDEPENDENT_AMBULATORY_CARE_PROVIDER_SITE_OTHER): Payer: Medicaid Other

## 2021-05-29 DIAGNOSIS — I469 Cardiac arrest, cause unspecified: Secondary | ICD-10-CM

## 2021-06-03 NOTE — Progress Notes (Signed)
Remote ICD transmission.   

## 2021-08-28 ENCOUNTER — Ambulatory Visit (INDEPENDENT_AMBULATORY_CARE_PROVIDER_SITE_OTHER): Payer: Medicaid Other

## 2021-08-28 DIAGNOSIS — I469 Cardiac arrest, cause unspecified: Secondary | ICD-10-CM

## 2021-09-01 LAB — CUP PACEART REMOTE DEVICE CHECK
Battery Remaining Longevity: 78 mo
Battery Voltage: 2.98 V
Brady Statistic RV Percent Paced: 0 %
Date Time Interrogation Session: 20230509012505
HighPow Impedance: 72 Ohm
Implantable Lead Implant Date: 20181017
Implantable Lead Location: 753860
Implantable Pulse Generator Implant Date: 20181017
Lead Channel Impedance Value: 380 Ohm
Lead Channel Impedance Value: 437 Ohm
Lead Channel Pacing Threshold Amplitude: 0.75 V
Lead Channel Pacing Threshold Pulse Width: 0.4 ms
Lead Channel Sensing Intrinsic Amplitude: 31.625 mV
Lead Channel Sensing Intrinsic Amplitude: 31.625 mV
Lead Channel Setting Pacing Amplitude: 2 V
Lead Channel Setting Pacing Pulse Width: 0.4 ms
Lead Channel Setting Sensing Sensitivity: 0.3 mV

## 2021-09-10 NOTE — Progress Notes (Signed)
Remote ICD transmission.   

## 2021-11-27 ENCOUNTER — Ambulatory Visit (INDEPENDENT_AMBULATORY_CARE_PROVIDER_SITE_OTHER): Payer: Medicaid Other

## 2021-11-27 DIAGNOSIS — I469 Cardiac arrest, cause unspecified: Secondary | ICD-10-CM | POA: Diagnosis not present

## 2021-12-01 LAB — CUP PACEART REMOTE DEVICE CHECK
Battery Remaining Longevity: 62 mo
Battery Voltage: 2.98 V
Brady Statistic RV Percent Paced: 0.01 %
Date Time Interrogation Session: 20230808001705
HighPow Impedance: 73 Ohm
Implantable Lead Implant Date: 20181017
Implantable Lead Location: 753860
Implantable Pulse Generator Implant Date: 20181017
Lead Channel Impedance Value: 380 Ohm
Lead Channel Impedance Value: 456 Ohm
Lead Channel Pacing Threshold Amplitude: 0.75 V
Lead Channel Pacing Threshold Pulse Width: 0.4 ms
Lead Channel Sensing Intrinsic Amplitude: 31.625 mV
Lead Channel Sensing Intrinsic Amplitude: 31.625 mV
Lead Channel Setting Pacing Amplitude: 2 V
Lead Channel Setting Pacing Pulse Width: 0.4 ms
Lead Channel Setting Sensing Sensitivity: 0.3 mV

## 2021-12-17 NOTE — Progress Notes (Signed)
Remote ICD transmission.   

## 2022-02-26 ENCOUNTER — Ambulatory Visit (INDEPENDENT_AMBULATORY_CARE_PROVIDER_SITE_OTHER): Payer: Medicaid Other

## 2022-02-26 DIAGNOSIS — I469 Cardiac arrest, cause unspecified: Secondary | ICD-10-CM

## 2022-03-01 LAB — CUP PACEART REMOTE DEVICE CHECK
Battery Remaining Longevity: 61 mo
Battery Voltage: 2.96 V
Brady Statistic RV Percent Paced: 0.01 %
Date Time Interrogation Session: 20231106022705
HighPow Impedance: 72 Ohm
Implantable Lead Connection Status: 753985
Implantable Lead Implant Date: 20181017
Implantable Lead Location: 753860
Implantable Pulse Generator Implant Date: 20181017
Lead Channel Impedance Value: 380 Ohm
Lead Channel Impedance Value: 437 Ohm
Lead Channel Pacing Threshold Amplitude: 0.75 V
Lead Channel Pacing Threshold Pulse Width: 0.4 ms
Lead Channel Sensing Intrinsic Amplitude: 31.625 mV
Lead Channel Sensing Intrinsic Amplitude: 31.625 mV
Lead Channel Setting Pacing Amplitude: 2 V
Lead Channel Setting Pacing Pulse Width: 0.4 ms
Lead Channel Setting Sensing Sensitivity: 0.3 mV
Zone Setting Status: 755011

## 2022-03-08 NOTE — Progress Notes (Signed)
Remote ICD transmission.   

## 2022-03-12 NOTE — Progress Notes (Deleted)
Electrophysiology Office Note Date: 03/12/2022  ID:  Raven Martinez, DOB [birth year], MRN [id number]  PCP: Reece Leader, DO Primary Cardiologist: None Electrophysiologist: Dr. Johney Frame -> None ***  CC: Routine ICD follow-up  Raven Martinez is a 51 y.o. female seen today for None for routine electrophysiology followup. Since last being seen in our clinic the patient reports doing ***.  she denies chest pain, palpitations, dyspnea, PND, orthopnea, nausea, vomiting, dizziness, syncope, edema, weight gain, or early satiety.     {He/she (caps):30048} has not had ICD shocks.   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, HOCM, long QT History of appropriate therapy: No History of AAD therapy: No  Past Medical History:  Diagnosis Date   Cardiac arrest [facility])    s/p rescucitation for VF with ICD implanted   Hypertension    Hypertrophic cardiomegaly    Past Surgical History:  Procedure Laterality Date   CESAREAN SECTION     ICD IMPLANT N/A 02/09/2017   Medtronic Visa AF MRI conditional ICD implanted for secondary prevention (HCM with VF arrest) by Dr Johney Frame   LEFT HEART CATH AND CORONARY ANGIOGRAPHY N/A 02/02/2017   Procedure: LEFT HEART CATH AND CORONARY ANGIOGRAPHY;  Surgeon: Lyn Records, MD;  Location: [facility];  Service: Cardiovascular;  Laterality: N/A;    Current Outpatient Medications  Medication Sig Dispense Refill   aspirin EC 81 MG tablet Take 1 tablet (81 mg total) by mouth daily. 90 tablet 3   atorvastatin (LIPITOR) 40 MG tablet Take 1 tablet (40 mg total) by mouth daily. 30 tablet 0   folic acid (FOLVITE) 1 MG tablet Take 1 tablet (1 mg total) by mouth daily. 30 tablet 0   metoprolol succinate (TOPROL-XL) 100 MG 24 hr tablet Take 1 tablet (100 mg total) by mouth 2 (two) times daily. 60 tablet 0   verapamil (CALAN-SR) 180 MG CR tablet Take 1 tablet (180 mg total) by mouth daily. 30 tablet 0   No current facility-administered  medications for this visit.    Allergies:   Patient has no known allergies.   Social History: Social History   Socioeconomic History   Marital status: Single    Spouse name: Not on file   Number of children: Not on file   Years of education: Not on file   Highest education level: Not on file  Occupational History   Not on file  Tobacco Use   Smoking status: Every Day   Smokeless tobacco: Current  Substance and Sexual Activity   Alcohol use: Yes   Drug use: Yes    Types: Marijuana   Sexual activity: Not on file  Other Topics Concern   Not on file  Social History Narrative   Not on file   Social Determinants of Health   Financial Resource Strain: Not on file  Food Insecurity: Not on file  Transportation Needs: Not on file  Physical Activity: Not on file  Stress: Not on file  Social Connections: Not on file  Intimate Partner Violence: Not on file    Family History: Family History  Problem Relation Age of Onset   Hypertension Mother    Diabetes Mother    Kidney disease Mother    Heart attack Father    Hypertension Father     Review of Systems: All other systems reviewed and are otherwise negative except as noted above.   Physical Exam: There were no vitals filed for this visit.  GEN- The patient is well appearing, alert and oriented x 3 today.   HEENT: normocephalic, atraumatic; sclera clear, conjunctiva pink; hearing intact; oropharynx clear; neck supple, no JVP Lymph- no cervical lymphadenopathy Lungs- Clear to ausculation bilaterally, normal work of breathing.  No wheezes, rales, rhonchi Heart- {Blank single:19197::"Regular","Irregularly irregular"}  rate and rhythm, no murmurs, rubs or gallops, PMI not laterally displaced GI- soft, non-tender, non-distended, bowel sounds present, no hepatosplenomegaly Extremities- no clubbing or cyanosis. {EDEMA FMBWG:66599} peripheral edema; DP/PT/radial pulses 2+ bilaterally MS- no significant deformity or  atrophy Skin- warm and dry, no rash or lesion; ICD pocket well healed Psych- euthymic mood, full affect Neuro- strength and sensation are intact  ICD interrogation- reviewed in detail today,  See PACEART report  EKG:  EKG {ACTION; IS/IS JTT:[id number]} ordered today. Personal review of EKG ordered {Blank single:19197::"today","***"} shows ***  Recent Labs: No results found for requested labs within last 365 days.   Wt Readings from Last 3 Encounters:  01/03/18 126 lb 6.4 oz (57.3 kg)  07/26/17 122 lb 8 oz (55.6 kg)  05/11/17 114 lb (51.7 kg)     Other studies Reviewed: Additional studies/ records that were reviewed today include: Previous EP office notes.   {Select studies to display:26339}   Assessment and Plan:  1.  VF arrest/HCM/Long QT s/p Medtronic single chamber ICD  euvolemic today Stable on an appropriate medical regimen Normal ICD function See Pace Art report No changes today  2. HCM Followed by AHF clinic, but not seen since 2019  3. Tobacco abuse ***  4. PAD Followed by Dr. Allyson Sabal  Current medicines are reviewed at length with the patient today.   =  Labs/ tests ordered today include: *** No orders of the defined types were placed in this encounter.    Disposition:   Follow up with {EPMDS:28135} {Blank single:19197::"in 2 weeks","in 4 weeks","in 3 months","in 6 months","in 12 months","as usual post gen change"}    Signed, Graciella Freer, PA-C  03/12/2022 3:20 PM  [facility] [address] [place] [address] [phone] (office) [phone] (fax)

## 2022-03-16 ENCOUNTER — Encounter: Payer: Medicaid Other | Admitting: Student

## 2022-05-28 ENCOUNTER — Ambulatory Visit (INDEPENDENT_AMBULATORY_CARE_PROVIDER_SITE_OTHER): Payer: Medicaid Other

## 2022-05-28 DIAGNOSIS — I469 Cardiac arrest, cause unspecified: Secondary | ICD-10-CM | POA: Diagnosis not present

## 2022-05-31 LAB — CUP PACEART REMOTE DEVICE CHECK
Battery Remaining Longevity: 51 mo
Battery Voltage: 2.98 V
Brady Statistic RV Percent Paced: 0 %
Date Time Interrogation Session: 20240205022722
HighPow Impedance: 67 Ohm
Implantable Lead Connection Status: 753985
Implantable Lead Implant Date: 20181017
Implantable Lead Location: 753860
Implantable Pulse Generator Implant Date: 20181017
Lead Channel Impedance Value: 380 Ohm
Lead Channel Impedance Value: 456 Ohm
Lead Channel Pacing Threshold Amplitude: 0.75 V
Lead Channel Pacing Threshold Pulse Width: 0.4 ms
Lead Channel Sensing Intrinsic Amplitude: 31.625 mV
Lead Channel Sensing Intrinsic Amplitude: 31.625 mV
Lead Channel Setting Pacing Amplitude: 2 V
Lead Channel Setting Pacing Pulse Width: 0.4 ms
Lead Channel Setting Sensing Sensitivity: 0.3 mV
Zone Setting Status: 755011

## 2022-06-15 NOTE — Progress Notes (Signed)
Remote ICD transmission.   

## 2022-08-27 ENCOUNTER — Ambulatory Visit (INDEPENDENT_AMBULATORY_CARE_PROVIDER_SITE_OTHER): Payer: Medicaid Other

## 2022-08-27 DIAGNOSIS — I469 Cardiac arrest, cause unspecified: Secondary | ICD-10-CM | POA: Diagnosis not present

## 2022-08-27 LAB — CUP PACEART REMOTE DEVICE CHECK
Battery Remaining Longevity: 45 mo
Battery Voltage: 2.98 V
Brady Statistic RV Percent Paced: 0 %
Date Time Interrogation Session: 20240503073825
HighPow Impedance: 69 Ohm
Implantable Lead Connection Status: 753985
Implantable Lead Implant Date: 20181017
Implantable Lead Location: 753860
Implantable Pulse Generator Implant Date: 20181017
Lead Channel Impedance Value: 342 Ohm
Lead Channel Impedance Value: 437 Ohm
Lead Channel Pacing Threshold Amplitude: 0.75 V
Lead Channel Pacing Threshold Pulse Width: 0.4 ms
Lead Channel Sensing Intrinsic Amplitude: 31.625 mV
Lead Channel Sensing Intrinsic Amplitude: 31.625 mV
Lead Channel Setting Pacing Amplitude: 2 V
Lead Channel Setting Pacing Pulse Width: 0.4 ms
Lead Channel Setting Sensing Sensitivity: 0.3 mV
Zone Setting Status: 755011

## 2022-09-15 NOTE — Progress Notes (Signed)
Remote ICD transmission.   

## 2022-09-23 ENCOUNTER — Encounter: Payer: Self-pay | Admitting: Internal Medicine

## 2022-09-23 ENCOUNTER — Ambulatory Visit: Payer: Medicaid Other | Attending: Cardiology | Admitting: Cardiology

## 2022-09-23 NOTE — Progress Notes (Deleted)
  Electrophysiology Office Follow up Visit Note:    Date:  09/23/2022   ID:  Raven Martinez, DOB Feb 18, 1971, MRN [id number]  PCP:  Raven Leader, DO  [facility] Cardiologist:  None  [facility] Electrophysiologist:  Raven Prude, MD    Interval History:    Raven Martinez is a 52 y.o. female who presents for a follow up visit.   She was previously a patient of Dr. Johney Frame.  She was last seen in clinic with heart care on January 03, 2018 by Dr. Shirlee Latch.  She was admitted in October 2018 after she suffered an out of hospital cardiac arrest.  She had a heart cath at that time that showed no coronary artery disease.  Her MRI was suggestive of hypertrophic cardiomyopathy.  Her medical history also includes hypertension, tobacco abuse, peripheral arterial disease.  Recent device interrogations have shown frequent salvos of tachycardia with an appearance suggestive of SVT.     Past medical, surgical, social and family history were reviewed.  ROS:   Please see the history of present illness.    All other systems reviewed and are negative.  EKGs/Labs/Other Studies Reviewed:    The following studies were reviewed today:  May 30th 2024 in clinic device interrogation personally reviewed ***  EKG:  The ekg ordered today demonstrates ***   Physical Exam:    VS:  There were no vitals taken for this visit.    Wt Readings from Last 3 Encounters:  01/03/18 126 lb 6.4 oz (57.3 kg)  07/26/17 122 lb 8 oz (55.6 kg)  05/11/17 114 lb (51.7 kg)     GEN: *** Well nourished, well developed in no acute distress CARDIAC: ***RRR, no murmurs, rubs, gallops RESPIRATORY:  Clear to auscultation without rales, wheezing or rhonchi       ASSESSMENT:    No diagnosis found. PLAN:    In order of problems listed above:  #Tachycardia Appearance on ICD EGM suggestive of SVT. ***Taking beta-blocker and calcium channel blocker as prescribed?  #HOCM  #ICD in situ Device functioning  appropriately.  Continue remote monitoring.   Follow-up with Dr. Izora Ribas        Total time spent with patient today *** minutes. This includes reviewing records, evaluating the patient and coordinating care.    Signed, Steffanie Dunn, MD, [facility], [facility] 09/23/2022 5:44 AM    Electrophysiology [facility]

## 2023-02-25 ENCOUNTER — Ambulatory Visit (INDEPENDENT_AMBULATORY_CARE_PROVIDER_SITE_OTHER): Payer: Medicaid Other

## 2023-02-25 DIAGNOSIS — I469 Cardiac arrest, cause unspecified: Secondary | ICD-10-CM

## 2023-02-27 LAB — CUP PACEART REMOTE DEVICE CHECK
Battery Remaining Longevity: 38 mo
Battery Voltage: 2.95 V
Brady Statistic RV Percent Paced: 0 %
Date Time Interrogation Session: 20241102043730
HighPow Impedance: 69 Ohm
Implantable Lead Connection Status: 753985
Implantable Lead Implant Date: 20181017
Implantable Lead Location: 753860
Implantable Pulse Generator Implant Date: 20181017
Lead Channel Impedance Value: 342 Ohm
Lead Channel Impedance Value: 437 Ohm
Lead Channel Pacing Threshold Amplitude: 0.75 V
Lead Channel Pacing Threshold Pulse Width: 0.4 ms
Lead Channel Sensing Intrinsic Amplitude: 31.625 mV
Lead Channel Sensing Intrinsic Amplitude: 31.625 mV
Lead Channel Setting Pacing Amplitude: 2 V
Lead Channel Setting Pacing Pulse Width: 0.4 ms
Lead Channel Setting Sensing Sensitivity: 0.3 mV
Zone Setting Status: 755011

## 2023-03-09 NOTE — Progress Notes (Signed)
Remote ICD transmission.   

## 2023-03-10 ENCOUNTER — Other Ambulatory Visit (HOSPITAL_COMMUNITY): Payer: Self-pay | Admitting: Cardiology

## 2023-05-27 ENCOUNTER — Ambulatory Visit: Payer: Medicaid Other

## 2023-05-27 DIAGNOSIS — I469 Cardiac arrest, cause unspecified: Secondary | ICD-10-CM

## 2023-05-30 LAB — CUP PACEART REMOTE DEVICE CHECK
Battery Remaining Longevity: 39 mo
Battery Voltage: 2.96 V
Brady Statistic RV Percent Paced: 0 %
Date Time Interrogation Session: 20250202072307
HighPow Impedance: 66 Ohm
Implantable Lead Connection Status: 753985
Implantable Lead Implant Date: 20181017
Implantable Lead Location: 753860
Implantable Pulse Generator Implant Date: 20181017
Lead Channel Impedance Value: 342 Ohm
Lead Channel Impedance Value: 437 Ohm
Lead Channel Pacing Threshold Amplitude: 0.75 V
Lead Channel Pacing Threshold Pulse Width: 0.4 ms
Lead Channel Sensing Intrinsic Amplitude: 31.625 mV
Lead Channel Sensing Intrinsic Amplitude: 31.625 mV
Lead Channel Setting Pacing Amplitude: 2 V
Lead Channel Setting Pacing Pulse Width: 0.4 ms
Lead Channel Setting Sensing Sensitivity: 0.3 mV
Zone Setting Status: 755011

## 2023-06-04 ENCOUNTER — Encounter: Payer: Self-pay | Admitting: Cardiology

## 2023-07-01 NOTE — Progress Notes (Signed)
 Remote ICD transmission.

## 2023-07-01 NOTE — Addendum Note (Signed)
 Addended by: Elease Etienne A on: 07/01/2023 12:36 PM   Modules accepted: Orders

## 2023-07-06 ENCOUNTER — Ambulatory Visit: Payer: Self-pay | Attending: Cardiology | Admitting: Cardiology

## 2023-07-06 NOTE — Progress Notes (Deleted)
  Electrophysiology Office Follow up Visit Note:    Date:  07/06/2023   ID:  Raven Martinez, DOB [birth year], MRN [id number]  PCP:  Reece Leader, DO (Inactive)  [facility] Cardiologist:  None  [facility] Electrophysiologist:  Lanier Prude, MD    Interval History:     Raven Martinez is a 53 y.o. female who presents for a follow up visit.   Was previously followed by Dr. Johney Frame and last saw him May 11, 2017.  She has a history of a ventricular fibrillation arrest and now has an ICD in situ.  She also has a history of hypertension and hypertrophic cardiomyopathy.  Her ICD was implanted in October 2018 by Dr. Johney Frame.        Past medical, surgical, social and family history were reviewed.  ROS:   Please see the history of present illness.    All other systems reviewed and are negative.  EKGs/Labs/Other Studies Reviewed:    The following studies were reviewed today:  July 06, 2023 in clinic device interrogation personally reviewed ***  March 28, 2017 EKG shows sinus rhythm.  LVH.   October 2018 cardiac MRI EF 59% Subtle mid wall late gadolinium enhancement in the basal to mid anterior wall       Physical Exam:    VS:  There were no vitals taken for this visit.    Wt Readings from Last 3 Encounters:  01/03/18 126 lb 6.4 oz (57.3 kg)  07/26/17 122 lb 8 oz (55.6 kg)  05/11/17 114 lb (51.7 kg)     GEN: no distress CARD: RRR, No MRG.  Prepectoral pocket well-healed. RESP: No IWOB. CTAB.      ASSESSMENT:    1. Hypertrophic cardiomyopathy (HCC)   2. Implantable cardioverter-defibrillator (ICD) in situ   3. Cardiac arrest [facility])    PLAN:    In order of problems listed above:  #Hypertrophic cardiomyopathy #History of cardiac arrest #ICD in situ No recent high-voltage therapies.  Her arrhythmia appears to be stable.  She has NYHA class II symptoms.  Her ICD is functioning appropriately.  We will continue remote monitoring. Continue  verapamil and metoprolol  Update echocardiogram   Recommend follow-up with EP APP in 12 months.   Signed, Steffanie Dunn, MD, [facility], [facility] 07/06/2023 5:25 AM    Electrophysiology [facility]

## 2023-07-08 ENCOUNTER — Encounter: Payer: Self-pay | Admitting: Cardiology

## 2023-08-26 ENCOUNTER — Ambulatory Visit (INDEPENDENT_AMBULATORY_CARE_PROVIDER_SITE_OTHER): Payer: Medicaid Other

## 2023-08-26 DIAGNOSIS — I469 Cardiac arrest, cause unspecified: Secondary | ICD-10-CM

## 2023-08-29 LAB — CUP PACEART REMOTE DEVICE CHECK
Battery Remaining Longevity: 41 mo
Battery Voltage: 2.96 V
Brady Statistic RV Percent Paced: 0 %
Date Time Interrogation Session: 20250503031607
HighPow Impedance: 72 Ohm
Implantable Lead Connection Status: 753985
Implantable Lead Implant Date: 20181017
Implantable Lead Location: 753860
Implantable Pulse Generator Implant Date: 20181017
Lead Channel Impedance Value: 342 Ohm
Lead Channel Impedance Value: 437 Ohm
Lead Channel Pacing Threshold Amplitude: 0.75 V
Lead Channel Pacing Threshold Pulse Width: 0.4 ms
Lead Channel Sensing Intrinsic Amplitude: 31.625 mV
Lead Channel Sensing Intrinsic Amplitude: 31.625 mV
Lead Channel Setting Pacing Amplitude: 2 V
Lead Channel Setting Pacing Pulse Width: 0.4 ms
Lead Channel Setting Sensing Sensitivity: 0.3 mV
Zone Setting Status: 755011

## 2023-08-31 ENCOUNTER — Encounter: Payer: Self-pay | Admitting: Cardiology

## 2023-10-05 NOTE — Progress Notes (Signed)
 Remote ICD transmission.

## 2023-10-05 NOTE — Addendum Note (Signed)
 Addended by: Lott Rouleau A on: 10/05/2023 02:32 PM   Modules accepted: Orders

## 2023-11-25 ENCOUNTER — Ambulatory Visit: Payer: Medicaid Other

## 2023-11-25 DIAGNOSIS — I469 Cardiac arrest, cause unspecified: Secondary | ICD-10-CM

## 2023-11-28 ENCOUNTER — Ambulatory Visit: Payer: Self-pay | Admitting: Cardiology

## 2023-11-28 LAB — CUP PACEART REMOTE DEVICE CHECK
Battery Remaining Longevity: 38 mo
Battery Voltage: 2.95 V
Brady Statistic RV Percent Paced: 0 %
Date Time Interrogation Session: 20250802223731
HighPow Impedance: 71 Ohm
Implantable Lead Connection Status: 753985
Implantable Lead Implant Date: 20181017
Implantable Lead Location: 753860
Implantable Pulse Generator Implant Date: 20181017
Lead Channel Impedance Value: 342 Ohm
Lead Channel Impedance Value: 456 Ohm
Lead Channel Pacing Threshold Amplitude: 0.625 V
Lead Channel Pacing Threshold Pulse Width: 0.4 ms
Lead Channel Sensing Intrinsic Amplitude: 31.625 mV
Lead Channel Sensing Intrinsic Amplitude: 31.625 mV
Lead Channel Setting Pacing Amplitude: 2 V
Lead Channel Setting Pacing Pulse Width: 0.4 ms
Lead Channel Setting Sensing Sensitivity: 0.3 mV
Zone Setting Status: 755011

## 2023-11-30 NOTE — Telephone Encounter (Signed)
 Spoke w/ patient - she is scheduled to see Dr. Cindie on 9/11.

## 2024-01-04 NOTE — Progress Notes (Deleted)
  Electrophysiology Office Follow up Visit Note:    Date:  01/04/2024   ID:  Raven Martinez, DOB March 16, 1971, MRN [id number]  PCP:  Hardy Pears, DO (Inactive)  [facility] Cardiologist:  None  [facility] Electrophysiologist:  OLE ONEIDA HOLTS, MD    Interval History:     Raven Martinez is a 53 y.o. female who presents for a follow up visit.   The patient was previously followed by Dr. Kelsie and last saw him May 11, 2017.  Has a history of cardiac arrest with an ICD in situ.        Past medical, surgical, social and family history were reviewed.  ROS:   Please see the history of present illness.    All other systems reviewed and are negative.  EKGs/Labs/Other Studies Reviewed:    The following studies were reviewed today:  January 05, 2024 in-clinic device interrogation personally reviewed ***        Physical Exam:    VS:  There were no vitals taken for this visit.    Wt Readings from Last 3 Encounters:  01/03/18 126 lb 6.4 oz (57.3 kg)  07/26/17 122 lb 8 oz (55.6 kg)  05/11/17 114 lb (51.7 kg)     GEN: no distress CARD: RRR, No MRG.  ICD pocket well-healed RESP: No IWOB. CTAB.      ASSESSMENT:    No diagnosis found. PLAN:    In order of problems listed above:  #Resuscitated ventricular fibrillation arrest #ICD in situ Device functioning appropriately.  Continue remote monitoring.  Follow-up with APP in 1 year.   Signed, OLE HOLTS, MD, [facility], [facility] 01/04/2024 1:19 PM    Electrophysiology [facility]

## 2024-01-05 ENCOUNTER — Ambulatory Visit: Payer: Self-pay | Attending: Cardiology | Admitting: Cardiology

## 2024-01-05 DIAGNOSIS — Z95810 Presence of automatic (implantable) cardiac defibrillator: Secondary | ICD-10-CM

## 2024-01-05 DIAGNOSIS — I469 Cardiac arrest, cause unspecified: Secondary | ICD-10-CM

## 2024-01-06 ENCOUNTER — Encounter: Payer: Self-pay | Admitting: Cardiology

## 2024-01-23 NOTE — Progress Notes (Signed)
 Remote ICD Transmission

## 2024-03-08 ENCOUNTER — Ambulatory Visit
Payer: Self-pay | Attending: Student in an Organized Health Care Education/Training Program | Admitting: Student in an Organized Health Care Education/Training Program

## 2024-05-30 ENCOUNTER — Telehealth: Payer: Self-pay | Admitting: Student in an Organized Health Care Education/Training Program

## 2024-05-30 NOTE — Telephone Encounter (Signed)
 Patient was called regarding missed remote transmission.
